# Patient Record
Sex: Male | Born: 1955 | Race: Black or African American | Hispanic: No | Marital: Married | Smoking: Never smoker
Health system: Southern US, Community
[De-identification: ages and names within clinical notes are randomized; demographics above are authoritative.]

## PROBLEM LIST (undated history)

## (undated) DIAGNOSIS — C61 Malignant neoplasm of prostate: Secondary | ICD-10-CM

## (undated) DIAGNOSIS — B1910 Unspecified viral hepatitis B without hepatic coma: Secondary | ICD-10-CM

## (undated) DIAGNOSIS — K219 Gastro-esophageal reflux disease without esophagitis: Secondary | ICD-10-CM

## (undated) DIAGNOSIS — E079 Disorder of thyroid, unspecified: Secondary | ICD-10-CM

## (undated) DIAGNOSIS — N400 Enlarged prostate without lower urinary tract symptoms: Secondary | ICD-10-CM

## (undated) DIAGNOSIS — E041 Nontoxic single thyroid nodule: Secondary | ICD-10-CM

## (undated) DIAGNOSIS — I10 Essential (primary) hypertension: Secondary | ICD-10-CM

## (undated) DIAGNOSIS — E785 Hyperlipidemia, unspecified: Secondary | ICD-10-CM

## (undated) DIAGNOSIS — J309 Allergic rhinitis, unspecified: Secondary | ICD-10-CM

## (undated) HISTORY — DX: Disorder of thyroid, unspecified: E07.9

## (undated) HISTORY — DX: Allergic rhinitis, unspecified: J30.9

## (undated) HISTORY — PX: COLONOSCOPY: SHX174

## (undated) HISTORY — DX: Unspecified viral hepatitis B without hepatic coma: B19.10

## (undated) HISTORY — PX: EYE SURGERY: SHX253

## (undated) HISTORY — DX: Gastro-esophageal reflux disease without esophagitis: K21.9

---

## 1998-09-12 ENCOUNTER — Encounter: Admission: RE | Admit: 1998-09-12 | Discharge: 1998-09-12 | Payer: Self-pay | Admitting: Internal Medicine

## 1998-10-10 ENCOUNTER — Encounter: Admission: RE | Admit: 1998-10-10 | Discharge: 1998-10-10 | Payer: Self-pay | Admitting: Internal Medicine

## 2000-05-15 ENCOUNTER — Encounter: Payer: Self-pay | Admitting: Urology

## 2000-05-15 ENCOUNTER — Encounter: Admission: RE | Admit: 2000-05-15 | Discharge: 2000-05-15 | Payer: Self-pay | Admitting: Urology

## 2007-06-17 ENCOUNTER — Encounter: Admission: RE | Admit: 2007-06-17 | Discharge: 2007-06-17 | Payer: Self-pay | Admitting: Internal Medicine

## 2007-07-10 ENCOUNTER — Ambulatory Visit (HOSPITAL_COMMUNITY): Admission: RE | Admit: 2007-07-10 | Discharge: 2007-07-10 | Payer: Self-pay | Admitting: Endocrinology

## 2007-07-10 ENCOUNTER — Encounter (INDEPENDENT_AMBULATORY_CARE_PROVIDER_SITE_OTHER): Payer: Self-pay | Admitting: Interventional Radiology

## 2010-03-26 ENCOUNTER — Encounter: Payer: Self-pay | Admitting: Internal Medicine

## 2011-08-08 ENCOUNTER — Other Ambulatory Visit: Payer: Self-pay | Admitting: Internal Medicine

## 2011-08-08 DIAGNOSIS — E041 Nontoxic single thyroid nodule: Secondary | ICD-10-CM

## 2011-08-17 ENCOUNTER — Other Ambulatory Visit: Payer: Self-pay

## 2011-08-17 ENCOUNTER — Inpatient Hospital Stay: Admission: RE | Admit: 2011-08-17 | Payer: Self-pay | Source: Ambulatory Visit

## 2011-12-20 ENCOUNTER — Ambulatory Visit: Payer: Self-pay | Admitting: Gastroenterology

## 2013-08-05 ENCOUNTER — Encounter: Payer: Self-pay | Admitting: Internal Medicine

## 2013-08-05 ENCOUNTER — Ambulatory Visit (INDEPENDENT_AMBULATORY_CARE_PROVIDER_SITE_OTHER): Payer: 59 | Admitting: Internal Medicine

## 2013-08-05 VITALS — BP 156/92 | HR 84 | Temp 98.7°F | Wt 177.0 lb

## 2013-08-05 DIAGNOSIS — N390 Urinary tract infection, site not specified: Secondary | ICD-10-CM | POA: Insufficient documentation

## 2013-08-05 MED ORDER — FOSFOMYCIN TROMETHAMINE 3 G PO PACK: PACK | ORAL | Status: DC

## 2013-08-05 NOTE — Progress Notes (Signed)
Patient ID: Tony Kim, male   DOB: [birth year], 57 y.o.   MRN: [id number]         [facility] for Infectious Disease  Reason for Consult: Urinary tract infection with multidrug resistant Escherichia coli Referring Physician: Dr. Deland Pretty  Patient Active Problem List   Diagnosis Date Noted  . UTI (urinary tract infection) 08/05/2013    Patient's Medications  New Prescriptions   FOSFOMYCIN (MONUROL) 3 G PACK    Take 3 g in 4 ounces of water or daily every 3 days for 3 doses  Previous Medications   LORATADINE-PSEUDOEPHEDRINE (CLARITIN-D 12-HOUR) 5-120 MG PER TABLET    Take 1 tablet by mouth as needed for allergies.   NAPROXEN (NAPROSYN) 250 MG TABLET    Take 250 mg by mouth as needed.   PSEUDOEPH-DOXYLAMINE-DM-APAP (NYQUIL PO)    Take 30 mLs by mouth as needed.   PSEUDOEPHEDRINE-APAP-DM (DAYQUIL MULTI-SYMPTOM COLD/FLU PO)    Take 15 mLs by mouth as needed.   TAMSULOSIN (FLOMAX) 0.4 MG CAPS CAPSULE    Take 0.4 mg by mouth.  Modified Medications   No medications on file  Discontinued Medications   DOXYCYCLINE (VIBRAMYCIN) 100 MG CAPSULE    Take 100 mg by mouth 2 (two) times daily.   NITROFURANTOIN (MACRODANTIN) 100 MG CAPSULE    Take 100 mg by mouth with snacks. Not started yet, 07/22/13    Recommendations: 1. Repeat urinalysis and urine culture 2. Fosfomycin 3 g by mouth every 3 days for 3 doses 3. Discontinue doxycycline 4. Followup on June 16   Assessment: He has smoldering symptoms of cystitis or prostatitis related to multidrug resistant Escherichia coli. His organism was resistant to all antibiotics tested other than nitrofurantoin and carbapenam antibiotics. It is highly likely that his organism would be sensitive to fosfomycin. I will treat him with a 3 dose regimen after repeat UA and urine culture. I will see him back in 2 weeks. If he has prostatitis he may need a longer course of therapy.  HPI: Tony Kim is a 58 y.o. male who developed dysuria and  frequency around May 11. This was associated with some fevers, chills and mild bilateral flank pain. He was seen on May 14. Urine culture was obtained and he was started on empiric doxycycline. His fever, chills and flank pain resolved but he has continued to have dysuria. His urine culture grew multidrug resistant Escherichia coli resistant to tetracycline meds. He was switched to oral nitrofurantoin but had no further improvement in his symptoms. He was switched back to doxycycline and referred here for further evaluation.  He recalls having one prior, similar episode about 15 years ago that resolved promptly with a short course of antibiotics. He is required other treatment with antibiotics only very rarely over his lifetime. He is married and his wife is in good health. She has not had problems with urinary tract infections or other infections that required frequent antibiotic therapy. 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  Review of Systems: Constitutional: positive for chills, fevers and sweats, negative for anorexia and weight loss Eyes: negative Ears, nose, mouth, throat, and face: negative Respiratory: negative Cardiovascular: negative Gastrointestinal: negative Genitourinary:positive for dysuria and frequency, negative for decreased stream, hematuria, hesitancy, nocturia and urinary incontinence    Past Medical History  Diagnosis Date  . GERD (gastroesophageal reflux disease)   . Thyroid disease   . Hepatitis B   . Allergic rhinitis     History  Substance Use Topics  . Smoking status: Never Smoker   . Smokeless tobacco: Never Used  . Alcohol Use: 1.0 oz/week    2 drink(s) per week    No family history on file. No Known Allergies  OBJECTIVE: Blood pressure 156/92, pulse 84, temperature 98.7 F (37.1 C), temperature source Oral, weight 177 lb (80.287 kg). General: He is well dressed and in good spirits Skin: No rash Lungs: Clear Cor: Regular  S1-S2 no murmurs Abdomen: Soft and nontender. He has no suprapubic or CVA tenderness   Microbiology: No results found for this or any previous visit (from the past 240 hour(s)).  Michel Bickers, MD [facility] for [facility] [phone] pager   [phone] cell 08/05/2013, 5:48 PM

## 2013-08-06 LAB — URINALYSIS, ROUTINE W REFLEX MICROSCOPIC
Bilirubin Urine: NEGATIVE
Glucose, UA: NEGATIVE mg/dL
Hgb urine dipstick: NEGATIVE
Ketones, ur: NEGATIVE mg/dL
LEUKOCYTES UA: NEGATIVE
NITRITE: NEGATIVE
PROTEIN: NEGATIVE mg/dL
Specific Gravity, Urine: 1.023 (ref 1.005–1.030)
UROBILINOGEN UA: 0.2 mg/dL (ref 0.0–1.0)
pH: 7 (ref 5.0–8.0)

## 2013-08-07 LAB — URINE CULTURE

## 2013-08-20 ENCOUNTER — Ambulatory Visit (INDEPENDENT_AMBULATORY_CARE_PROVIDER_SITE_OTHER): Payer: 59 | Admitting: Internal Medicine

## 2013-08-20 ENCOUNTER — Encounter: Payer: Self-pay | Admitting: Internal Medicine

## 2013-08-20 VITALS — BP 144/85 | HR 80 | Temp 98.2°F | Wt 172.2 lb

## 2013-08-20 DIAGNOSIS — N3000 Acute cystitis without hematuria: Secondary | ICD-10-CM

## 2013-08-20 NOTE — Progress Notes (Signed)
Patient ID: Tony Kim, male   DOB: [birth year], 58 y.o.   MRN: [id number]         [facility] for Infectious Disease  Patient Active Problem List   Diagnosis Date Noted  . UTI (urinary tract infection) 08/05/2013    Patient's Medications  New Prescriptions   No medications on file  Previous Medications   LORATADINE-PSEUDOEPHEDRINE (CLARITIN-D 12-HOUR) 5-120 MG PER TABLET    Take 1 tablet by mouth as needed for allergies.   NAPROXEN (NAPROSYN) 250 MG TABLET    Take 250 mg by mouth as needed.   PSEUDOEPH-DOXYLAMINE-DM-APAP (NYQUIL PO)    Take 30 mLs by mouth as needed.   PSEUDOEPHEDRINE-APAP-DM (DAYQUIL MULTI-SYMPTOM COLD/FLU PO)    Take 15 mLs by mouth as needed.   TAMSULOSIN (FLOMAX) 0.4 MG CAPS CAPSULE    Take 0.4 mg by mouth.  Modified Medications   No medications on file  Discontinued Medications   FOSFOMYCIN (MONUROL) 3 G PACK    Take 3 g in 4 ounces of water or daily every 3 days for 3 doses    Subjective: Tony Kim is in for his followup visit. He completed his course of fosfomycin than noted resolution of his dysuria. However yesterday, after an hour-long meetings and a very cold conference room, he started to feel bad and developed some mild, recurrent dysuria. He went home early from work and took some DayQuil. He slept from 7 PM until early this morning. He has had some intermittent, mild dysuria today. He has not had any fever or sweats. Review of Systems: Pertinent items are noted in HPI.  Past Medical History  Diagnosis Date  . GERD (gastroesophageal reflux disease)   . Thyroid disease   . Hepatitis B   . Allergic rhinitis     History  Substance Use Topics  . Smoking status: Never Smoker   . Smokeless tobacco: Never Used  . Alcohol Use: 1.0 oz/week    2 drink(s) per week    No family history on file.  No Known Allergies  Objective: Temp: 98.2 F (36.8 C) (06/18 1554) Temp src: Oral (06/18 1554) BP: 144/85 mmHg (06/18 1554) Pulse Rate: 80  (06/18 1554)  General: He is in good spirits and in no distress Skin: No rash Lungs: Clear Cor: Regular S1 and S2 no murmurs Abdomen: Soft without any CVA or suprapubic tenderness   Assessment: He may be developing an early relapse of his complicated urinary tract infection.  Plan: 1. Observe off of antibiotics for now 2. Repeat UA and urine culture 3. Follow up by phone early next week   Michel Bickers, MD [facility] for [place] [phone] pager   [phone] cell 08/20/2013, 4:31 PM

## 2013-08-22 LAB — URINALYSIS, ROUTINE W REFLEX MICROSCOPIC
GLUCOSE, UA: NEGATIVE mg/dL
Hgb urine dipstick: NEGATIVE
KETONES UR: NEGATIVE mg/dL
Nitrite: NEGATIVE
PROTEIN: 30 mg/dL — AB
Specific Gravity, Urine: >1.03 — ABNORMAL HIGH (ref 1.005–1.030)
Urobilinogen, UA: 0.2 mg/dL (ref 0.0–1.0)
pH: 5.5 (ref 5.0–8.0)

## 2013-08-22 LAB — URINALYSIS, MICROSCOPIC ONLY
Bacteria, UA: NONE SEEN
CRYSTALS: NONE SEEN
SQUAMOUS EPITHELIAL / LPF: NONE SEEN

## 2013-08-24 ENCOUNTER — Encounter: Payer: Self-pay | Admitting: Internal Medicine

## 2013-08-24 ENCOUNTER — Telehealth: Payer: Self-pay | Admitting: *Deleted

## 2013-08-24 LAB — URINE CULTURE: Colony Count: >=100000

## 2013-08-24 NOTE — Telephone Encounter (Signed)
Please see the patient's email question and advise.  Thank you. Tony KitchenLandis Gandy, RN ________________________ Dr. Megan Salon, I was anticipating hearing from you regarding the results from the urinary analysis as promised. What's next step? Please let me know. Thanks, Tony Kim

## 2013-08-24 NOTE — Telephone Encounter (Signed)
I spoke with Tony Kim by phone today. He has not taken his temperature but is still having cold spells and intermittent, mild dysuria. He feels like his urinary tract infection is coming back. His urinalyses last week was abnormal and urine culture grew Escherichia coli again, resistant to all oral antibiotics tested (fosfomycin susceptibility testing is not available). Since he has relapsed quickly after finishing fosfomycin he will probably need IV carbapenam therapy. He agrees with having a PICC placed and starting treatment, probably with ertapenem once daily for 2 weeks.

## 2013-08-25 ENCOUNTER — Telehealth: Payer: Self-pay | Admitting: *Deleted

## 2013-08-25 ENCOUNTER — Other Ambulatory Visit: Payer: Self-pay | Admitting: Internal Medicine

## 2013-08-25 DIAGNOSIS — N390 Urinary tract infection, site not specified: Secondary | ICD-10-CM

## 2013-08-25 NOTE — Telephone Encounter (Signed)
These arrangements have already be done (by Langley Gauss).

## 2013-08-25 NOTE — Telephone Encounter (Signed)
I'll be happy to set this up.  What strength ertapenem?

## 2013-08-25 NOTE — Telephone Encounter (Signed)
Dr. Megan Salon has ordered PICC placement and HH IV Ertapenam 1 gram daily x 14 days.  PICC placement scheduled for Wed., August 26, 2013 @ 1200.  Pt to arrive at the [facility] entrance to [facility] at 1115 for registration.  First dose of IV antibiotic to be administered by [facility] Short Stay, 08/26/13.  Order faxed to Short Stay to administer first dose of IV antibiotic ([phone]).  Pt chose to use [facility] for IV antibiotics and nursing care.  Orders faxed to [facility] along with pt records, med list and labs.  IV antibiotics duration is 14 days.

## 2013-08-26 ENCOUNTER — Ambulatory Visit (HOSPITAL_COMMUNITY)
Admission: RE | Admit: 2013-08-26 | Discharge: 2013-08-26 | Disposition: A | Discharge status: Home / Self Care | Payer: 59 | Source: Ambulatory Visit | Attending: Internal Medicine | Admitting: Internal Medicine

## 2013-08-26 ENCOUNTER — Encounter (HOSPITAL_COMMUNITY)
Admission: RE | Admit: 2013-08-26 | Discharge: 2013-08-26 | Disposition: A | Discharge status: Home / Self Care | Payer: 59 | Source: Ambulatory Visit | Attending: Internal Medicine | Admitting: Internal Medicine

## 2013-08-26 ENCOUNTER — Other Ambulatory Visit: Payer: Self-pay | Admitting: Internal Medicine

## 2013-08-26 DIAGNOSIS — A498 Other bacterial infections of unspecified site: Secondary | ICD-10-CM | POA: Diagnosis not present

## 2013-08-26 DIAGNOSIS — N390 Urinary tract infection, site not specified: Secondary | ICD-10-CM | POA: Insufficient documentation

## 2013-08-26 MED ORDER — SODIUM CHLORIDE 0.9 % IV SOLN
1.0000 g | Freq: Once | INTRAVENOUS | Status: AC
  Administered 2013-08-26: 1 g via INTRAVENOUS
  Filled 2013-08-26: qty 1

## 2013-08-26 MED ORDER — HEPARIN SOD (PORK) LOCK FLUSH 100 UNIT/ML IV SOLN: 250.0000 [IU] | INTRAVENOUS | Status: DC | PRN

## 2013-08-26 MED ORDER — HEPARIN SOD (PORK) LOCK FLUSH 100 UNIT/ML IV SOLN
INTRAVENOUS | Status: AC
  Administered 2013-08-26: 500 [IU]
  Filled 2013-08-26: qty 5

## 2013-08-26 NOTE — Procedures (Signed)
Successful RUE SL POWER PICC TIP SVC/RA NO COMP STABLE READY FOR USE  

## 2013-08-31 ENCOUNTER — Telehealth: Payer: Self-pay | Admitting: *Deleted

## 2013-08-31 NOTE — Telephone Encounter (Signed)
I will fill out his FMLA papers.

## 2013-08-31 NOTE — Telephone Encounter (Signed)
Patient experiencing extreme fatigue on his iv antibiotic therapy.  He is concerned that he is not able to work during this time, would like to know if Dr. Megan Salon would consider filling out FMLA paperwork to cover his 14 day treatment.  RN advised patient to have his paperwork sent to our office for Dr. Megan Salon to review.  We will contact the patient back with more information today. Tony Gandy, RN

## 2013-09-01 ENCOUNTER — Encounter: Payer: Self-pay | Admitting: Internal Medicine

## 2013-09-01 ENCOUNTER — Telehealth: Payer: Self-pay | Admitting: *Deleted

## 2013-09-01 NOTE — Telephone Encounter (Signed)
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 Megan Salon.  Would like a letter keeping him out of work in the mean time.  Due to his fatigue and the antibiotic administration schedule, he has been unable to come in to the office and work a full day.  While he has tried to telecommute, the fatigue again prevents him from working a full day during office hours.  He has been advised that if he cannot complete a full day telecommuting, then he should be written out of work completely.   He is requesting a doctor's note excusing him from work from the start of therapy 08/26/13 through his follow up appointment on 09/09/13.  He would like this note to be faxed to his local [place] office, attention Zenaida Niece (phone [phone], fax [phone].   Landis Gandy, RN

## 2013-09-01 NOTE — Telephone Encounter (Signed)
Faxed the letter to Zenaida Niece.  Notified patient. Landis Gandy, RN

## 2013-09-03 ENCOUNTER — Encounter: Payer: Self-pay | Admitting: *Deleted

## 2013-09-03 NOTE — Progress Notes (Signed)
Patient ID: Tony Kim, male   DOB: February 25, 1956, 58 y.o.   MRN: [id number] Received paperwork from [facility].  Requested paperwork faxed to [facility].  Pt's f/u appt is 09/09/13 @ 4:15 PM w/ Dr. Megan Salon.

## 2013-09-09 ENCOUNTER — Encounter: Payer: Self-pay | Admitting: Internal Medicine

## 2013-09-09 ENCOUNTER — Ambulatory Visit (INDEPENDENT_AMBULATORY_CARE_PROVIDER_SITE_OTHER): Payer: 59 | Admitting: Internal Medicine

## 2013-09-09 VITALS — BP 165/90 | HR 71 | Temp 97.8°F | Wt 177.0 lb

## 2013-09-09 DIAGNOSIS — N3000 Acute cystitis without hematuria: Secondary | ICD-10-CM

## 2013-09-09 NOTE — Progress Notes (Signed)
Patient ID: Tony Kim, male   DOB: 13-Aug-1955, 58 y.o.   MRN: [id number]         [facility] for Infectious Disease  Patient Active Problem List   Diagnosis Date Noted  . UTI (urinary tract infection) 08/05/2013    Patient's Medications  New Prescriptions   No medications on file  Previous Medications   ERTAPENEM SODIUM IV    Inject into the vein.   LORATADINE-PSEUDOEPHEDRINE (CLARITIN-D 12-HOUR) 5-120 MG PER TABLET    Take 1 tablet by mouth as needed for allergies.   NAPROXEN (NAPROSYN) 250 MG TABLET    Take 250 mg by mouth as needed.   PSEUDOEPH-DOXYLAMINE-DM-APAP (NYQUIL PO)    Take 30 mLs by mouth as needed.   PSEUDOEPHEDRINE-APAP-DM (DAYQUIL MULTI-SYMPTOM COLD/FLU PO)    Take 15 mLs by mouth as needed.   TAMSULOSIN (FLOMAX) 0.4 MG CAPS CAPSULE    Take 0.4 mg by mouth.  Modified Medications   No medications on file  Discontinued Medications   No medications on file    Subjective: Tony Kim is in with his wife today for his followup visit. He will complete 14 days of IV ertapenem this afternoon for his ESBL producing Escherichia coli cystitis. He has had no problems tolerating his PICC. He states that he has felt more fatigued since starting ertapenem but otherwise has not had any side effects. His dysuria resolved promptly after starting ertapenem. He has not had any fever. Review of Systems: Pertinent items are noted in HPI.  Past Medical History  Diagnosis Date  . GERD (gastroesophageal reflux disease)   . Thyroid disease   . Hepatitis B   . Allergic rhinitis     History  Substance Use Topics  . Smoking status: Never Smoker   . Smokeless tobacco: Never Used  . Alcohol Use: 1.0 oz/week    2 drink(s) per week    No family history on file.  No Known Allergies  Objective: Temp: 97.8 F (36.6 C) (07/08 1557) Temp src: Oral (07/08 1557) BP: 165/90 mmHg (07/08 1557) Pulse Rate: 71 (07/08 1557)  General: He is in no distress Skin: Right arm PICC  site appears good Lungs: Clear Cor: Regular S1 and S2 no murmurs Abdomen: Soft and nontender. No CVA tenderness   Assessment: He's had a good response to therapy for his UTI.  Plan: 1. Discontinue ertapenem this afternoon and have PICC removed 2. He may return to work on Monday, July 13 3. Followup here as needed   Tony Bickers, MD [facility] for [place] [phone] pager   [phone] cell 09/09/2013, 4:22 PM

## 2014-02-01 ENCOUNTER — Ambulatory Visit (INDEPENDENT_AMBULATORY_CARE_PROVIDER_SITE_OTHER): Payer: 59 | Admitting: Physician Assistant

## 2014-02-01 VITALS — BP 150/78 | HR 70 | Temp 98.1°F | Resp 16 | Ht 67.0 in | Wt 174.6 lb

## 2014-02-01 DIAGNOSIS — J069 Acute upper respiratory infection, unspecified: Secondary | ICD-10-CM

## 2014-02-01 MED ORDER — IPRATROPIUM BROMIDE 0.03 % NA SOLN: 2.0000 | Freq: Two times a day (BID) | NASAL | Status: DC

## 2014-02-01 MED ORDER — BENZONATATE 100 MG PO CAPS: 100.0000 mg | ORAL_CAPSULE | Freq: Three times a day (TID) | ORAL | Status: DC | PRN

## 2014-02-01 NOTE — Progress Notes (Signed)
   Subjective:    Patient ID: Tony Kim, male    DOB: [birth year], 58 y.o.   MRN: [id number]  HPI Patient presents for cough that has been present for 4 days that has gotten progressively worse and is sometimes productive. Accompanied with rhinorrhea, sore throat, sinus pressure, and congestion. Denies fever, SOB, or CP. Denies sick contacts. Has tried AlkaSeltzer, NyQuil, and Mucinex without much relief.    Review of Systems  Constitutional: Negative for fever, appetite change and fatigue.  HENT: Positive for congestion, rhinorrhea, sinus pressure and sore throat. Negative for ear discharge, ear pain, postnasal drip and sneezing.   Eyes: Negative for pain, discharge and itching.  Respiratory: Positive for cough. Negative for chest tightness, shortness of breath and wheezing.   Cardiovascular: Negative for chest pain and palpitations.  Gastrointestinal: Negative for nausea, vomiting and abdominal pain.  Allergic/Immunologic: Negative for environmental allergies and food allergies.  Neurological: Positive for headaches. Negative for dizziness and light-headedness.       Objective:   Physical Exam  Constitutional: He appears well-developed and well-nourished. No distress.  Blood pressure 150/78, pulse 70, temperature 98.1 F (36.7 C), temperature source Oral, resp. rate 16, height 5\' 7"  (1.702 m), weight 174 lb 9.6 oz (79.198 kg), SpO2 99 %.   HENT:  Head: Normocephalic and atraumatic.  Right Ear: External ear normal.  Mouth/Throat: Oropharynx is clear and moist. No oropharyngeal exudate.  Eyes: Conjunctivae are normal. Pupils are equal, round, and reactive to light. Right eye exhibits no discharge. Left eye exhibits no discharge. No scleral icterus.  Neck: Normal range of motion. Neck supple. No thyromegaly present.  Cardiovascular: Normal rate, regular rhythm and normal heart sounds.  Exam reveals no gallop and no friction rub.   No murmur heard. Pulmonary/Chest: Effort normal  and breath sounds normal. No respiratory distress. He has no wheezes. He has no rales. He exhibits no tenderness.  Abdominal: Soft. Bowel sounds are normal. There is no tenderness.  Lymphadenopathy:    He has no cervical adenopathy.  Skin: Skin is warm and dry. No rash noted. No erythema. No pallor.      Assessment & Plan:  1. Upper respiratory infection, viral Get plenty of fluid and rest.  - ipratropium (ATROVENT) 0.03 % nasal spray; Place 2 sprays into both nostrils 2 (two) times daily.  Dispense: 30 mL; Refill: 0 - benzonatate (TESSALON) 100 MG capsule; Take 1-2 capsules (100-200 mg total) by mouth 3 (three) times daily as needed for cough.  Dispense: 40 capsule; Refill: 0 - Continue to take Mucinex.   Alveta Heimlich PA-C  [facility] 02/01/2014 3:20 PM

## 2014-02-01 NOTE — Patient Instructions (Signed)
Upper Respiratory Infection, Adult An upper respiratory infection (URI) is also sometimes known as the common cold. The upper respiratory tract includes the nose, sinuses, throat, trachea, and bronchi. Bronchi are the airways leading to the lungs. Most people improve within 1 week, but symptoms can last up to 2 weeks. A residual cough may last even longer.  CAUSES Many different viruses can infect the tissues lining the upper respiratory tract. The tissues become irritated and inflamed and often become very moist. Mucus production is also common. A cold is contagious. You can easily spread the virus to others by oral contact. This includes kissing, sharing a glass, coughing, or sneezing. Touching your mouth or nose and then touching a surface, which is then touched by another person, can also spread the virus. SYMPTOMS  Symptoms typically develop 1 to 3 days after you come in contact with a cold virus. Symptoms vary from person to person. They may include:  Runny nose.  Sneezing.  Nasal congestion.  Sinus irritation.  Sore throat.  Loss of voice (laryngitis).  Cough.  Fatigue.  Muscle aches.  Loss of appetite.  Headache.  Low-grade fever. DIAGNOSIS  You might diagnose your own cold based on familiar symptoms, since most people get a cold 2 to 3 times a year. Your caregiver can confirm this based on your exam. Most importantly, your caregiver can check that your symptoms are not due to another disease such as strep throat, sinusitis, pneumonia, asthma, or epiglottitis. Blood tests, throat tests, and X-rays are not necessary to diagnose a common cold, but they may sometimes be helpful in excluding other more serious diseases. Your caregiver will decide if any further tests are required. RISKS AND COMPLICATIONS  You may be at risk for a more severe case of the common cold if you smoke cigarettes, have chronic heart disease (such as heart failure) or lung disease (such as asthma), or if  you have a weakened immune system. The very young and very old are also at risk for more serious infections. Bacterial sinusitis, middle ear infections, and bacterial pneumonia can complicate the common cold. The common cold can worsen asthma and chronic obstructive pulmonary disease (COPD). Sometimes, these complications can require emergency medical care and may be life-threatening. PREVENTION  The best way to protect against getting a cold is to practice good hygiene. Avoid oral or hand contact with people with cold symptoms. Wash your hands often if contact occurs. There is no clear evidence that vitamin C, vitamin E, echinacea, or exercise reduces the chance of developing a cold. However, it is always recommended to get plenty of rest and practice good nutrition. TREATMENT  Treatment is directed at relieving symptoms. There is no cure. Antibiotics are not effective, because the infection is caused by a virus, not by bacteria. Treatment may include:  Increased fluid intake. Sports drinks offer valuable electrolytes, sugars, and fluids.  Breathing heated mist or steam (vaporizer or shower).  Eating chicken soup or other clear broths, and maintaining good nutrition.  Getting plenty of rest.  Using gargles or lozenges for comfort.  Controlling fevers with ibuprofen or acetaminophen as directed by your caregiver.  Increasing usage of your inhaler if you have asthma. Zinc gel and zinc lozenges, taken in the first 24 hours of the common cold, can shorten the duration and lessen the severity of symptoms. Pain medicines may help with fever, muscle aches, and throat pain. A variety of non-prescription medicines are available to treat congestion and runny nose. Your caregiver   can make recommendations and may suggest nasal or lung inhalers for other symptoms.  HOME CARE INSTRUCTIONS   Only take over-the-counter or prescription medicines for pain, discomfort, or fever as directed by your  caregiver.  Use a warm mist humidifier or inhale steam from a shower to increase air moisture. This may keep secretions moist and make it easier to breathe.  Drink enough water and fluids to keep your urine clear or pale yellow.  Rest as needed.  Return to work when your temperature has returned to normal or as your caregiver advises. You may need to stay home longer to avoid infecting others. You can also use a face mask and careful hand washing to prevent spread of the virus. SEEK MEDICAL CARE IF:   After the first few days, you feel you are getting worse rather than better.  You need your caregiver's advice about medicines to control symptoms.  You develop chills, worsening shortness of breath, or brown or red sputum. These may be signs of pneumonia.  You develop yellow or brown nasal discharge or pain in the face, especially when you bend forward. These may be signs of sinusitis.  You develop a fever, swollen neck glands, pain with swallowing, or white areas in the back of your throat. These may be signs of strep throat. SEEK IMMEDIATE MEDICAL CARE IF:   You have a fever.  You develop severe or persistent headache, ear pain, sinus pain, or chest pain.  You develop wheezing, a prolonged cough, cough up blood, or have a change in your usual mucus (if you have chronic lung disease).  You develop sore muscles or a stiff neck. Document Released: 08/15/2000 Document Revised: 05/14/2011 Document Reviewed: 05/27/2013 Exit[employer] Patient Information 2015 [employer]. This information is not intended to replace advice given to you by your health care provider. Make sure you discuss any questions you have with your health care provider.  

## 2014-02-03 NOTE — Progress Notes (Signed)
I have discussed this case with Ms. Ricki Miller, PA-C and agree.

## 2015-03-06 HISTORY — PX: PROSTATE BIOPSY: SHX241

## 2015-08-24 ENCOUNTER — Other Ambulatory Visit: Payer: Self-pay | Admitting: Internal Medicine

## 2015-08-24 DIAGNOSIS — E041 Nontoxic single thyroid nodule: Secondary | ICD-10-CM

## 2015-09-27 ENCOUNTER — Other Ambulatory Visit: Payer: Self-pay

## 2016-11-12 ENCOUNTER — Other Ambulatory Visit: Payer: Self-pay | Admitting: Internal Medicine

## 2016-11-12 DIAGNOSIS — E041 Nontoxic single thyroid nodule: Secondary | ICD-10-CM

## 2016-11-16 ENCOUNTER — Other Ambulatory Visit: Payer: Self-pay | Admitting: Pharmacist

## 2016-11-20 ENCOUNTER — Encounter: Payer: Self-pay | Admitting: Internal Medicine

## 2016-11-20 DIAGNOSIS — B181 Chronic viral hepatitis B without delta-agent: Secondary | ICD-10-CM | POA: Insufficient documentation

## 2016-11-21 ENCOUNTER — Ambulatory Visit
Admission: RE | Admit: 2016-11-21 | Discharge: 2016-11-21 | Disposition: A | Discharge status: Home / Self Care | Payer: 59 | Source: Ambulatory Visit | Attending: Internal Medicine | Admitting: Internal Medicine

## 2016-11-21 DIAGNOSIS — E041 Nontoxic single thyroid nodule: Secondary | ICD-10-CM

## 2016-12-03 ENCOUNTER — Encounter: Payer: Self-pay | Admitting: Internal Medicine

## 2016-12-03 ENCOUNTER — Ambulatory Visit (INDEPENDENT_AMBULATORY_CARE_PROVIDER_SITE_OTHER): Payer: 59 | Admitting: Internal Medicine

## 2016-12-03 DIAGNOSIS — B181 Chronic viral hepatitis B without delta-agent: Secondary | ICD-10-CM | POA: Diagnosis not present

## 2016-12-03 NOTE — Progress Notes (Signed)
[facility]  Reason for Consult: Chronic hepatitis B Referring Physician: Dr. Deland Pretty  Assessment: He has chronic active hepatitis B. He is e antigen positive as of one month ago with a viral load of 82,400,000. I will check a full set of baseline lab today including a liver fibrosis panel and see him back in one month to review treatment options. I also suggested having his wife tested again and vaccinated if she is not hepatitis B surface antibody positive.  Plan: 1. Check baseline lab work today 2. Recommend hepatitis B testing and vaccination for his wife 3. Follow-up in one month   Patient Active Problem List   Diagnosis Date Noted  . Chronic viral hepatitis B without delta-agent ([place]) 11/20/2016  . UTI (urinary tract infection) 08/05/2013    Patient's Medications  New Prescriptions   No medications on file  Previous Medications   BENZONATATE (TESSALON) 100 MG CAPSULE    Take 1-2 capsules (100-200 mg total) by mouth 3 (three) times daily as needed for cough.   IPRATROPIUM (ATROVENT) 0.03 % NASAL SPRAY    Place 2 sprays into both nostrils 2 (two) times daily.   LORATADINE-PSEUDOEPHEDRINE (CLARITIN-D 12-HOUR) 5-120 MG PER TABLET    Take 1 tablet by mouth as needed for allergies.   NAPROXEN (NAPROSYN) 250 MG TABLET    Take 250 mg by mouth as needed.   PSEUDOEPH-DOXYLAMINE-DM-APAP (NYQUIL PO)    Take 30 mLs by mouth as needed.   PSEUDOEPHEDRINE-APAP-DM (DAYQUIL MULTI-SYMPTOM COLD/FLU PO)    Take 15 mLs by mouth as needed.   TAMSULOSIN (FLOMAX) 0.4 MG CAPS CAPSULE    Take 0.4 mg by mouth.  Modified Medications   No medications on file  Discontinued Medications   No medications on file    HPI: Tony Kim is a 61 y.o. male who is referred to me for evaluation and management of chronic hepatitis B. He states that he first learned of his hepatitis B Infection in the 1980s. He believes he was infected by his mother who died this past  2022/09/20. She had hepatitis B infection. He denies other risk fctors. He says that he has never had any complications of his infection that he is aware of. He has never been on treatment. He says that his wife has been tested and is hepatitis B negative.He is not sure if she has ever been vaccinated. 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  Review of Systems: Review of Systems  Constitutional: Negative for chills, diaphoresis, fever, malaise/fatigue and weight loss.  HENT: Negative for sore throat.   Respiratory: Negative for cough, sputum production and shortness of breath.   Cardiovascular: Negative for chest pain.  Gastrointestinal: Negative for abdominal pain, diarrhea, heartburn, nausea and vomiting.  Genitourinary: Negative for dysuria and frequency.  Musculoskeletal: Negative for joint pain and myalgias.  Skin: Negative for rash.  Neurological: Negative for dizziness and headaches.  Psychiatric/Behavioral: Negative for depression and substance abuse. The patient is not nervous/anxious.       Past Medical History:  Diagnosis Date  . Allergic rhinitis   . GERD (gastroesophageal reflux disease)   . Hepatitis B   . Thyroid disease     Social History  Substance Use Topics  . Smoking status: Never Smoker  . Smokeless tobacco: Never Used  . Alcohol use 1.0 oz/week    2 drink(s)  per week    Family History  Problem Relation Age of Onset  . Hyperlipidemia Mother   . Hyperlipidemia Father    No Known Allergies  OBJECTIVE: Vitals:   12/03/16 1425  BP: (!) 165/92  Pulse: 69  Temp: (!) 97.5 F (36.4 C)  TempSrc: Oral  Weight: 184 lb (83.5 kg)  Height: 5\' 6"  (1.676 m)   Body mass index is 29.7 kg/m.   Physical Exam  Constitutional: He is oriented to person, place, and time.  HENT:  Mouth/Throat: No oropharyngeal exudate.  Eyes: Conjunctivae are normal.  Cardiovascular: Normal rate  and regular rhythm.   No murmur heard. Pulmonary/Chest: Breath sounds normal.  Abdominal: Soft. He exhibits no mass. There is no tenderness.  Musculoskeletal: Normal range of motion.  Neurological: He is alert and oriented to person, place, and time.  Skin: No rash noted.  Psychiatric: Mood and affect normal.    Microbiology: No results found for this or any previous visit (from the past 240 hour(s)).  Michel Bickers, MD [facility] [phone] pager   [phone] cell 12/03/2016, 2:57 PM

## 2016-12-05 LAB — LIVER FIBROSIS, FIBROTEST-ACTITEST
ALT: 51 U/L — ABNORMAL HIGH (ref 9–46)
APOLIPOPROTEIN A1: 161 mg/dL (ref 94–176)
Alpha-2-Macroglobulin: 144 mg/dL (ref 106–279)
Bilirubin: 0.7 mg/dL (ref 0.2–1.2)
Fibrosis Score: 0.41
GGT: 18 U/L (ref 3–70)
Haptoglobin: 12 mg/dL — ABNORMAL LOW (ref 43–212)
NECROINFLAMMAT ACT SCORE: 0.33
Reference ID: 2140783

## 2016-12-07 LAB — HEPATITIS B SURFACE ANTIGEN: Hepatitis B Surface Ag: REACTIVE — AB

## 2016-12-07 LAB — COMPREHENSIVE METABOLIC PANEL
AG RATIO: 1.7 (calc) (ref 1.0–2.5)
ALBUMIN MSPROF: 3.8 g/dL (ref 3.6–5.1)
ALT: 54 U/L — ABNORMAL HIGH (ref 9–46)
AST: 85 U/L — ABNORMAL HIGH (ref 10–35)
Alkaline phosphatase (APISO): 47 U/L (ref 40–115)
BUN: 12 mg/dL (ref 7–25)
CO2: 27 mmol/L (ref 20–32)
Calcium: 8.3 mg/dL — ABNORMAL LOW (ref 8.6–10.3)
Chloride: 102 mmol/L (ref 98–110)
Creat: 1.01 mg/dL (ref 0.70–1.25)
Globulin: 2.3 g/dL (calc) (ref 1.9–3.7)
Glucose, Bld: 134 mg/dL — ABNORMAL HIGH (ref 65–99)
POTASSIUM: 4.1 mmol/L (ref 3.5–5.3)
Sodium: 139 mmol/L (ref 135–146)
Total Bilirubin: 1.1 mg/dL (ref 0.2–1.2)
Total Protein: 6.1 g/dL (ref 6.1–8.1)

## 2016-12-07 LAB — CBC
HCT: 36.2 % — ABNORMAL LOW (ref 38.5–50.0)
Hemoglobin: 12 g/dL — ABNORMAL LOW (ref 13.2–17.1)
MCH: 27.4 pg (ref 27.0–33.0)
MCHC: 33.1 g/dL (ref 32.0–36.0)
MCV: 82.6 fL (ref 80.0–100.0)
MPV: 11.1 fL (ref 7.5–12.5)
Platelets: 179 10*3/uL (ref 140–400)
RBC: 4.38 10*6/uL (ref 4.20–5.80)
RDW: 13.6 % (ref 11.0–15.0)
WBC: 3.4 10*3/uL — ABNORMAL LOW (ref 3.8–10.8)

## 2016-12-07 LAB — HEPATITIS DELTA ANTIBODY: Hepatitis D Ab, Total: NEGATIVE

## 2016-12-07 LAB — HIV ANTIBODY (ROUTINE TESTING W REFLEX): HIV: NONREACTIVE

## 2016-12-07 LAB — HEPATITIS A ANTIBODY, TOTAL: Hepatitis A AB,Total: REACTIVE — AB

## 2016-12-07 LAB — HEPATITIS C ANTIBODY
HEP C AB: NONREACTIVE
SIGNAL TO CUT-OFF: 0.01 (ref <1.00)

## 2017-01-31 DIAGNOSIS — R972 Elevated prostate specific antigen [PSA]: Secondary | ICD-10-CM | POA: Diagnosis not present

## 2017-11-13 DIAGNOSIS — Z0000 Encounter for general adult medical examination without abnormal findings: Secondary | ICD-10-CM | POA: Diagnosis not present

## 2017-11-13 DIAGNOSIS — Z125 Encounter for screening for malignant neoplasm of prostate: Secondary | ICD-10-CM | POA: Diagnosis not present

## 2017-11-18 DIAGNOSIS — Z0001 Encounter for general adult medical examination with abnormal findings: Secondary | ICD-10-CM | POA: Diagnosis not present

## 2017-11-18 DIAGNOSIS — Z23 Encounter for immunization: Secondary | ICD-10-CM | POA: Diagnosis not present

## 2017-11-19 ENCOUNTER — Other Ambulatory Visit: Payer: Self-pay | Admitting: Internal Medicine

## 2017-11-19 DIAGNOSIS — E041 Nontoxic single thyroid nodule: Secondary | ICD-10-CM

## 2017-11-28 ENCOUNTER — Encounter: Payer: Self-pay | Admitting: Hematology and Oncology

## 2017-11-28 ENCOUNTER — Ambulatory Visit
Admission: RE | Admit: 2017-11-28 | Discharge: 2017-11-28 | Disposition: A | Discharge status: Home / Self Care | Payer: 59 | Source: Ambulatory Visit | Attending: Internal Medicine | Admitting: Internal Medicine

## 2017-11-28 DIAGNOSIS — E041 Nontoxic single thyroid nodule: Secondary | ICD-10-CM

## 2017-12-05 ENCOUNTER — Encounter: Payer: Self-pay | Admitting: Hematology and Oncology

## 2017-12-05 ENCOUNTER — Inpatient Hospital Stay: Payer: BLUE CROSS/BLUE SHIELD

## 2017-12-05 ENCOUNTER — Inpatient Hospital Stay: Payer: BLUE CROSS/BLUE SHIELD | Attending: Hematology and Oncology | Admitting: Hematology and Oncology

## 2017-12-05 ENCOUNTER — Telehealth: Payer: Self-pay | Admitting: Hematology and Oncology

## 2017-12-05 VITALS — BP 144/96 | HR 71 | Temp 98.5°F | Resp 20 | Ht 66.0 in | Wt 184.0 lb

## 2017-12-05 DIAGNOSIS — D709 Neutropenia, unspecified: Secondary | ICD-10-CM | POA: Diagnosis not present

## 2017-12-05 DIAGNOSIS — E785 Hyperlipidemia, unspecified: Secondary | ICD-10-CM | POA: Diagnosis not present

## 2017-12-05 DIAGNOSIS — K219 Gastro-esophageal reflux disease without esophagitis: Secondary | ICD-10-CM

## 2017-12-05 DIAGNOSIS — B1910 Unspecified viral hepatitis B without hepatic coma: Secondary | ICD-10-CM

## 2017-12-05 DIAGNOSIS — R972 Elevated prostate specific antigen [PSA]: Secondary | ICD-10-CM | POA: Insufficient documentation

## 2017-12-05 DIAGNOSIS — N400 Enlarged prostate without lower urinary tract symptoms: Secondary | ICD-10-CM

## 2017-12-05 DIAGNOSIS — E7800 Pure hypercholesterolemia, unspecified: Secondary | ICD-10-CM | POA: Insufficient documentation

## 2017-12-05 DIAGNOSIS — Z8619 Personal history of other infectious and parasitic diseases: Secondary | ICD-10-CM | POA: Diagnosis not present

## 2017-12-05 DIAGNOSIS — R041 Hemorrhage from throat: Secondary | ICD-10-CM

## 2017-12-05 DIAGNOSIS — D72819 Decreased white blood cell count, unspecified: Secondary | ICD-10-CM | POA: Insufficient documentation

## 2017-12-05 LAB — COMPREHENSIVE METABOLIC PANEL
ALBUMIN: 3.9 g/dL (ref 3.5–5.0)
ALK PHOS: 63 U/L (ref 38–126)
ALT: 38 U/L (ref 0–44)
AST: 23 U/L (ref 15–41)
Anion gap: 6 (ref 5–15)
BILIRUBIN TOTAL: 1.2 mg/dL (ref 0.3–1.2)
BUN: 15 mg/dL (ref 8–23)
CALCIUM: 9 mg/dL (ref 8.9–10.3)
CO2: 30 mmol/L (ref 22–32)
CREATININE: 1.16 mg/dL (ref 0.61–1.24)
Chloride: 105 mmol/L (ref 98–111)
GFR calc Af Amer: >60 mL/min (ref >60)
Glucose, Bld: 90 mg/dL (ref 70–99)
Potassium: 4 mmol/L (ref 3.5–5.1)
SODIUM: 141 mmol/L (ref 135–145)
TOTAL PROTEIN: 7.1 g/dL (ref 6.5–8.1)

## 2017-12-05 LAB — RETICULOCYTES
RBC.: 4.9 MIL/uL (ref 4.20–5.82)
RETIC CT PCT: 0.7 % — AB (ref 0.8–1.8)
Retic Count, Absolute: 34.3 10*3/uL — ABNORMAL LOW (ref 34.8–93.9)

## 2017-12-05 LAB — CBC WITH DIFFERENTIAL (CANCER CENTER ONLY)
BASOS PCT: 1 %
Basophils Absolute: 0 10*3/uL (ref 0.0–0.1)
Eosinophils Absolute: 0.4 10*3/uL (ref 0.0–0.5)
Eosinophils Relative: 12 %
HEMATOCRIT: 40.7 % (ref 38.4–49.9)
HEMOGLOBIN: 13.4 g/dL (ref 13.0–17.1)
LYMPHS PCT: 45 %
Lymphs Abs: 1.3 10*3/uL (ref 0.9–3.3)
MCH: 27.3 pg (ref 27.2–33.4)
MCHC: 32.9 g/dL (ref 32.0–36.0)
MCV: 83.1 fL (ref 79.3–98.0)
MONO ABS: 0.2 10*3/uL (ref 0.1–0.9)
MONOS PCT: 5 %
NEUTROS ABS: 1.1 10*3/uL — AB (ref 1.5–6.5)
NEUTROS PCT: 37 %
Platelet Count: 203 10*3/uL (ref 140–400)
RBC: 4.9 MIL/uL (ref 4.20–5.82)
RDW: 14.1 % (ref 11.0–14.6)
WBC Count: 3 10*3/uL — ABNORMAL LOW (ref 4.0–10.3)

## 2017-12-05 LAB — FOLATE: Folate: 12.1 ng/mL (ref >5.9)

## 2017-12-05 LAB — VITAMIN B12: VITAMIN B 12: 460 pg/mL (ref 180–914)

## 2017-12-05 LAB — C-REACTIVE PROTEIN: CRP: <0.8 mg/dL (ref <1.0)

## 2017-12-05 LAB — SEDIMENTATION RATE: Sed Rate: 6 mm/hr (ref 0–16)

## 2017-12-05 LAB — LACTATE DEHYDROGENASE: LDH: 194 U/L — ABNORMAL HIGH (ref 98–192)

## 2017-12-05 NOTE — Telephone Encounter (Signed)
ge pt avs and calendar

## 2017-12-05 NOTE — Patient Instructions (Signed)
Evidence Yahr December 05, 2017  We discussed in detail the role, rationale, and possible explanation for a low white blood cell count.  At the time of your most recent laboratory studies and early September, there was no evidence of anemia or low platelets.  We discussed those prior results.  Additionally we discussed in lengthy detail, your long-standing history of hepatitis B, identified initially in the 45s.  There is curative treatment available at the present time.  Although you have seen Dr. Benson Norway, gastroenterology, in the past and he has recommended treatment, we will hold off, at your request, referring you until the results of your lab work from today are available for review.  Laboratory studies were obtained today to exclude any additional contributing factors such as nutritional deficiency, premature destruction of white blood cells, metabolic anomalies, or mineral deficiency.  Those results are pending.  Quantitative hepatitis B DNA was also requested.  Because of the inherent risk of liver problems and/or malignancy, an ultrasound of the liver/spleen was requested along with a specific baseline blood test.  This blood test is an associated marker associated for primary liver cancer.  The results of these blood tests will be discussed in detail at the time of your next visit.  Barring any unforeseen complications, your next scheduled doctor visit to discuss those results in detail he is on October 22.  Please do not hesitate to call should any questions arise in the interim.  Thank you!  Ladona Ridgel, MD Hematology/Oncology [phone]

## 2017-12-05 NOTE — Progress Notes (Signed)
[facility] Initial Consultation  Patient Name:  Tony Kim  DOB: Apr 10, 1955   Date of Service: December 05, 2017  Referring Provider: Deland Pretty, [address] [place], [place] [address]   Consulting Physician: Henreitta Leber, MD Hematology/Oncology  Patient Care Team: Patient Care Team: Deland Pretty, MD as PCP - General (Internal Medicine)   Reason for Referral: In the setting of leukopenia/neutropenia, without anemia or thrombocytopenia, he presents now for further diagnostic and therapeutic recommendations.  History Present Illness: Tony Kim is a 62 year old resident of [place], generally from [place], whose past medical history is significant for allergic rhinitis; benign thyroid nodule; untreated dyslipidemia; repaired detached retina; elevated PSA status post prostate biopsy in 2017; mild benign prostatic hypertrophy; and untreated hepatitis B identified initially in the 1980s by report. On December 03, 2016 his hepatitis A antibody was reactive.  The hepatitis D antibody was negative.  The hepatitis C antibody was nonreactive.  The hepatitis B surface antigen was reactive.  The hepatitis Be antibody was positive.  HIV 1 and HIV 2 were nonreactive.  The quantitative HBV DNA by PCR assay was 82,400,000. Although he had a screening colonoscopy in early 2019, under the direction of Dr.Patrick Benson Norway, gastroenterology, at [facility] and treatment of his hepatitis B was recommended, Tony Kim failed to follow-up on the recommendation, however.  His mother ironically died of complications of hepatitis B at the age of 76 years.  His primary care physician is Dr. Deland Pretty.  He is alone at this first visit.   A review of his laboratory studies from December 31, 2012 reveal hemoglobin 14.0 hematocrit 42.1 MCV 84.4 MCH 28.1 RDW 13.3 WBC 3.6; platelets 189,000.  On January 06, 2014 a complete blood count showed  hemoglobin 12.5 hematocrit 37.5 WBC 3.3; platelets 205,000.  On August 19, 2015 complete blood count showed hemoglobin 12.8 hematocrit 39.4 WBC 3.8; platelets 205,000.  On December 03, 2016 complete blood count showed hemoglobin 12.0 hematocrit 36.2 WBC 3.4; platelets 179,000.  A comprehensive metabolic panel on December 03, 2016 was normal except for calcium 8.3 SGOT 85 SGPT 54.  Tony Kim has no essential hypertension, coronary artery disease, or cardiac dysrhythmia.  His blood pressure, however, was elevated on 2 separate occasions in the clinic.  He does not have an ambulatory blood pressure kit.  He reports no seizure disorder or stroke syndrome. There has no thyroid disease.  He denies peptic ulcer or gastroesophageal reflux disease.  He has no rheumatoid or gouty arthritis.  Because his PSA was elevated and he complains even now of urinary urgency, likely benign prostatic hypertrophy, his prostate biopsy showed no malignancy in 2017.  He takes no medication.  He has no peripheral arterial venous thromboembolic disease.  In his family there is no history of any blood disorder or bleeding tendency.  Clinically both his appetite and weight remain stable. He has no unusual headache, dizziness, lightheadedness, syncope, or near syncopal episodes.  He has no rash or itching. Although he had a detached retina on the right, surgically repaired 2 years earlier, he still has some visual changes.  He reports no hearing deficit.  He reports no pain or difficulty in swallowing.  He has no unusual cough, sore throat, or orthopnea.  He denies dyspnea either at rest or on exertion.  No fever, shaking chills, sweats, or flulike symptoms are evident.  He has no chest or abdominal pain.  He reports no heartburn or indigestion.  There  is no nausea, vomiting, diarrhea, or constipation.  He moves his bowels 1-2 times daily.  He has no inflammatory bowel disease or symptomatic diverticulosis.  He denies melena or bright red blood per  rectum.  He has no urinary frequency, hematuria, or dysuria.  There is no bone, joint, muscle pain.  He denies any bleeding tendency or easy bruisability.  There is no numbness or tingling in the fingers or toes.  It is with this background he presents now for further diagnostic and therapeutic recommendations in the setting of leukopenia and mild neutropenia as outlined above.  Past Medical History:  Diagnosis Date  . Allergic rhinitis   . GERD (gastroesophageal reflux disease)   . Hepatitis B   . Thyroid disease   Dyslipidemia  Surgical History: Thyroid biopsy, 2009 Transrectal prostate biopsy, 2017 Detached retina surgery on the right, 2 years ago  Family History  Problem Relation Age of Onset  . Hyperlipidemia Mother   . Hyperlipidemia Father   Mother: Deceased: Age 81 years: Hepatitis B Father: Age 31 years: Degenerative joint disease Brothers (65): One brother died of bleeding peptic ulcer disease at the age of 57 years Sisters (2): One sister has kidney problems. There is no history of any blood disorders in the immediate family.  Social History   Socioeconomic History  . Marital status: Married    Spouse name: Not on file  . Number of children: Not on file  . Years of education: Not on file  . Highest education level: Not on file  Occupational History  . Not on file  Social Needs  . Financial resource strain: Not on file  . Food insecurity:    Worry: Not on file    Inability: Not on file  . Transportation needs:    Medical: Not on file    Non-medical: Not on file  Tobacco Use  . Smoking status: Never Smoker  . Smokeless tobacco: Never Used  Substance and Sexual Activity  . Alcohol use: Yes    Alcohol/week: 2.0 standard drinks    Types: 2 drink(s) per week  . Drug use: No  . Sexual activity: Yes    Partners: Female  Lifestyle  . Physical activity:    Days per week: Not on file    Minutes per session: Not on file  . Stress: Not on file  Relationships  .  Social connections:    Talks on phone: Not on file    Gets together: Not on file    Attends religious service: Not on file    Active member of club or organization: Not on file    Attends meetings of clubs or organizations: Not on file    Relationship status: Not on file  . Intimate partner violence:    Fear of current or ex partner: Not on file    Emotionally abused: Not on file    Physically abused: Not on file    Forced sexual activity: Not on file  Other Topics Concern  . Not on file  Social History Narrative  . Not on file  Tony Kim is married for the past 33 years. He worked previously as a Writer. He is currently unemployed. He has 2 healthy children ages 85 and 76 years. He is a lifetime non-smoker nondrinker He reports no recreational drug use.  Transfusion History: No prior transfusion  Exposure History: No known exposure to toxic chemicals, radiation, or pesticides.  Allergies:  He has no known medical allergies He has  no food allergies He has nonspecific seasonal allergies  Current Medications: Omega-3 fish oils: Take sporadically  Review of Systems: Constitutional: No fever, sweats, or shaking chills.  No appetite or weight deficit; energy level stable. Skin: No rash, scaling, sores, lumps, or jaundice. HEENT: No new visual changes or hearing deficit. Pulmonary: No unusual cough, sore throat, or orthopnea; without DOE or COPD. Cardiovascular: No coronary artery disease, angina, or myocardial infarction.  No cardiac dysrhythmia, essential hypertension; dyslipidemia. Gastrointestinal: No indigestion, dysphagia, abdominal pain, diarrhea, or constipation.  No change in bowel habits; reactive hepatitis B serology and elevated quantitative PCR identified. Genitourinary: No urinary frequency, hematuria, or dysuria.  Urinary urgency; BPH.  Elevated PSA followed by Dr. Alyson Ingles. Musculoskeletal: No arthralgias or myalgias; no joint swelling, pain,  or instability. Hematologic: No bleeding tendency or easy bruisability; leukopenia; relative eosinophilia without absolute eosinophilia. Endocrine: No intolerance to heat or cold; benign thyroid nodule; no diabetes mellitus. Vascular: No peripheral arterial or venous thromboembolic disease. Psychological: No anxiety, depression, or mood changes; no mental health illnesses. Neurological: No dizziness, lightheadedness, syncope, or near syncopal episodes; no numbness or tingling in the fingers or toes.  Physical Examination: Vital Signs: Body surface area is 1.97 meters squared.  Vitals:   12/05/17 1244 12/05/17 1416  BP: (!) 178/102 (!) 144/96  Pulse: 71   Resp: 20   Temp: 98.5 F (36.9 C)   SpO2: 100%     Filed Weights   12/05/17 1244  Weight: 184 lb (83.5 kg)  ECOG PERFORMANCE STATUS: 1 Constitutional:  Tony Kim is a fully nourished and developed African American from [place] originally.  He looks age appropriate.  He is friendly and cooperative without respiratory compromise at rest. Skin: No rashes, scaling, dryness, jaundice, or itching. HEENT: Head is normocephalic and atraumatic.  Pupils are equal round and reactive to light and accommodation.  Sclerae are anicteric.  Bilateral arcus senilis.  Conjunctivae are pink.  No sinus tenderness nor oropharyngeal lesions.  Lips without cracking or peeling; tongue without mass, inflammation, or nodularity.  Mucous membranes are moist. Neck: Supple and symmetric.  No jugular venous distention or thyromegaly.  Trachea is midline. Lymphatics: No cervical or supraclavicular lymphadenopathy.  No epitrochlear, axillary, or inguinal lymphadenopathy is appreciated. Respiratory/chest: Thorax is symmetrical.  Breath sounds are clear to auscultation and percussion.  Normal excursion and respiratory effort. Back: Symmetric without deformity or tenderness. Cardiovascular: Heart rate and rhythm are regular without murmurs or gallops.   Gastrointestinal: Abdomen is soft, nontender; no organomegaly.  Bowel sounds are normoactive.  No masses are appreciated. Genitourinary: Normal external male genitalia. Rectal examination: Not performed. Extremities: In the lower extremities, there is no asymmetric swelling, erythema, tenderness, or cord formation.  No clubbing, cyanosis, nor edema. Hematologic: No petechiae, hematomas, or ecchymoses. Psychological:  He is oriented to person, place, and time; normal affect, memory, and cognition. Neurological: There are no gross neurologic deficits.  Laboratory Results: I have reviewed the data as listed: December 05, 2017  Ref Range & Units 14:25 48yrago  WBC Count 4.0 - 10.3 K/uL 3.0Low   3.4Low  R  RBC 4.20 - 5.82 MIL/uL 4.90  4.38 R  Hemoglobin 13.0 - 17.1 g/dL 13.4  12.0Low  R  HCT 38.4 - 49.9 % 40.7  36.2Low  R  MCV 79.3 - 98.0 fL 83.1  82.6 R  MCH 27.2 - 33.4 pg 27.3  27.4 R  MCHC 32.0 - 36.0 g/dL 32.9  33.1   RDW 11.0 - 14.6 % 14.1  13.6 R  Platelet Count 140 - 400 K/uL 203  179 R  Neutrophils Relative % % 37    Neutro Abs 1.5 - 6.5 K/uL 1.1Low     Lymphocytes Relative % 45    Lymphs Abs 0.9 - 3.3 K/uL 1.3    Monocytes Relative % 5    Monocytes Absolute 0.1 - 0.9 K/uL 0.2    Eosinophils Relative % 12    Eosinophils Absolute 0.0 - 0.5 K/uL 0.4    Basophils Relative % 1    Basophils Absolute 0.0 - 0.1 K/uL 0.0     CBC Latest Ref Rng & Units 12/05/2017 12/03/2016  WBC 4.0 - 10.3 K/uL 3.0(L) 3.4(L)  Hemoglobin 13.0 - 17.1 g/dL 13.4 12.0(L)  Hematocrit 38.4 - 49.9 % 40.7 36.2(L)  Platelets 140 - 400 K/uL 203 179    CMP Latest Ref Rng & Units 12/05/2017 12/03/2016 12/03/2016  Glucose 70 - 99 mg/dL 90 134(H) -  BUN 8 - 23 mg/dL 15 12 -  Creatinine 0.61 - 1.24 mg/dL 1.16 1.01 -  Sodium 135 - 145 mmol/L 141 139 -  Potassium 3.5 - 5.1 mmol/L 4.0 4.1 -  Chloride 98 - 111 mmol/L 105 102 -  CO2 22 - 32 mmol/L 30 27 -  Calcium 8.9 - 10.3 mg/dL 9.0 8.3(L) -  Total Protein 6.5 -  8.1 g/dL 7.1 6.1 -  Total Bilirubin 0.3 - 1.2 mg/dL 1.2 1.1 -  Alkaline Phos 38 - 126 U/L 63 - -  AST 15 - 41 U/L 23 85(H) -  ALT 0 - 44 U/L 38 54(H) 51(H)   Diagnostic/Imaging Studies: June 17, 2007  ULTRASOUND OF HEAD/NECK SOFT TISSUES   Technique: Ultrasound examination of the head and neck soft tissues was performed in the area of clinical concern.   Comparison: None   Findings: The thyroid gland is somewhat elongate but otherwise normal in size.  The right lobe measures 6.6 cm sagittally with a depth of 1.6 cm and width of 1.9 cm.  The left lobe measures 5.9 x 1.5 x 1.8 cm, with the isthmus measuring 5.1 mm.  A hypo echoic nodules noted in the upper pole left lobe of 5 x 2 x 5 mm. Posterior to the inferior right lobe there is a solid nodule of 1.4 x 0.8 x 0.9 cm.  This probably represents a thyroid nodule but an adjacent nodule such as parathyroid adenoma would be difficult to exclude.   IMPRESSION:   1.  Thyroid gland somewhat elongate but otherwise normal in size. 2.  1.4 cm nodule from inferior posterior aspect of the right lobe may represent thyroid nodule but adjacent parathyroid adenoma cannot be excluded.  Tony Kim  Summary/Assessment: In the setting of leukopenia, without anemia or thrombocytopenia, he presents now for further diagnostic and therapeutic recommendations.  Tony Kim is a 62 year old resident of [place], generally from [place], whose past medical history is significant for allergic rhinitis; benign thyroid nodule; untreated dyslipidemia; detached retina; elevated PSA status post prostate biopsy in 2017; mild benign prostatic hypertrophy; and   Since the 1980s, he was aware that he had hepatitis B. On December 03, 2016 his hepatitis A antibody was reactive.  The hepatitis D antibody was negative.  The hepatitis C antibody was nonreactive.  The hepatitis B surface antigen was reactive.  The hepatitis Be antibody was positive.  HIV 1 and HIV 2  were nonreactive.  The quantitative HBV DNA by PCR assay was 82,400,000. Although he had a screening colonoscopy  in early 2019, under the direction of Dr.Patrick Benson Norway, gastroenterology, at [facility] and treatment of his hepatitis B was recommended, Tony Kim failed to follow-up on the recommendation, however.  His mother ironically died of complications of hepatitis B at the age of 39 years.  His primary care physician is Dr. Deland Pretty.  There is no other known hepatitis B infection in his immediate family.  He is alone at this first visit.   A review of his laboratory studies from December 31, 2012 reveal hemoglobin 14.0 hematocrit 42.1 MCV 84.4 MCH 28.1 RDW 13.3 WBC 3.6; platelets 189,000.  On January 06, 2014 a complete blood count showed hemoglobin 12.5 hematocrit 37.5 WBC 3.3; platelets 205,000.  On August 19, 2015 complete blood count showed hemoglobin 12.8 hematocrit 39.4 WBC 3.8; platelets 205,000.  On December 03, 2016 complete blood count showed hemoglobin 12.0 hematocrit 36.2 WBC 3.4; platelets 179,000.  A comprehensive metabolic panel on December 03, 2016 was normal except for calcium 8.3 SGOT 85 SGPT 54.  Tony Kim has no essential hypertension, coronary artery disease, or cardiac dysrhythmia.  His blood pressure, however, was elevated on 2 separate occasions in the Hematology Clinic.  He does not have an ambulatory blood pressure kit at home.  He reports no seizure disorder or stroke syndrome.  Has a known benign thyroid nodule which is followed closely.  He denies peptic ulcer or gastroesophageal reflux disease.  He has no rheumatoid or gouty arthritis.  Because his PSA was elevated and he complains even now of urinary urgency (likely benign prostatic hypertrophy) his prostate biopsy showed no malignancy in 2017.  He takes no medication.  He is followed by Dr. Nicolette Bang, urologic surgery.  His most recent PSA was reportedly 8.67.  He has no peripheral arterial venous thromboembolic  disease.  In his family, there is no history of any blood disorder or bleeding tendency.  Clinically both his appetite and weight remain stable. He has no unusual headache, dizziness, lightheadedness, syncope, or near syncopal episodes.  He has no rash or itching. Although he had a detached retina on the right, surgically repaired 2 years earlier, he still has some visual changes.  He reports no hearing deficit.  He reports no pain or difficulty in swallowing.  He has no unusual cough, sore throat, or orthopnea.  He denies dyspnea either at rest or on exertion.  No fever, shaking chills, sweats, or flulike symptoms are evident.  He has no chest or abdominal pain.  He reports no heartburn or indigestion.  There is no nausea, vomiting, diarrhea, or constipation.  He moves his bowels 1-2 times daily.  He has no inflammatory bowel disease or symptomatic diverticulosis.  He denies melena or bright red blood per rectum.  He has no urinary frequency, hematuria, or dysuria.  There is no bone, joint, muscle pain.  He denies any bleeding tendency or easy bruisability.  There is no numbness or tingling in the fingers or toes.   His other comorbid problems include allergic rhinitis; benign thyroid nodule; untreated dyslipidemia; repaired detached retina; elevated PSA status post prostate biopsy in 2017; and mild benign prostatic hypertrophy.  Recommendation/Plan: I had a lengthy discussion with evidence regarding his prior laboratory studies and persistent leukopenia and mild neutropenia without an absolute eosinophilia.  We discussed also in extensive detail his history with hepatitis B.  Although he was aware of his past diagnosis in the 1980s, and there is a vague history of some form of "treatment," at least within  the past 5-10 years, those records are not available for review.  He is not aware of how he contracted hepatitis B. His HIV serology was nonreactive.  His hepatitis B profile was previously reactive as  well as the hepatitis A antibody. He has no reactive serology for hepatitis C.  As mentioned above, however, his hepatitis B quantitative DNA PCR was strongly positive (82,400,000).  This likely suggests active infection.  This by itself could explain the leukopenia/neutropenia.  He has no eosinophilia.  The eosinophil percentage is relatively elevated while the absolute eosinophil count is <400.  A battery of laboratory studies were obtained to exclude any potential confounding factors contributing to his leukopenia.  Those factors include, but are not exclusive of, nutritional deficiencies, anabolic anomaly, and copper deficiency  His peripheral blood smear will be reviewed officially by hematopathology.  Because of the correlation between hepatitis B and hepatocellular carcinoma, an alpha-fetoprotein was requested.  In addition, an ultrasound of the abdomen to exclude either space-occupying lesions within the liver and/or splenomegaly was requested.    Because he has previously been seen by Dr. Carol Ada, gastroenterology, whom he likes, it was strongly recommended that he follow-up to address the hepatitis B serology and quantitative PCR.  At the present he declined until the results of today's laboratory studies are available. He has given [place] permission, however, to forward a copy of this initial evaluation to Dr. Benson Norway, for his review.  He assured me that he is willing to follow-up on subsequent treatment as appropriate.  Barring any unforeseen complications, his next scheduled doctor appointment to discuss the results of his abdominal ultrasound and laboratory investigation is on October 22.  He was advised to call us in the interim should any new or untoward problems arise.  The total time spent discussing the leukopenia/neutropenia, reviewing his prior laboratory studies, methodology for evaluating leukopenia/neutropenia, the implications of active untreated hepatitis B, role and rationale  for an abdominal ultrasound and AFP, preliminary considerations and recommendations was 60 minutes.  At least 50% of that time was spent in discussion, reviewing outside records, laboratory evaluation, counseling, and answering questions. All questions were answered to his satisfaction.  He knows to call the Clinic with any problems, questions, or concerns.  This note was dictated using voice activated technology/software.  Unfortunately, typographical errors are not uncommon, and transcription is subject to mistakes and regrettably misinterpretation.  If necessary, clarification of the above information can be discussed with me at any time.  Thank you Dr. Shelia Media for allowing my participation in the care of Tony Kim. I will keep you closely informed as the results of his preliminary laboratory data become available.  Please do not hesitate to call should any questions arise regarding this initial consultation and discussion.  FOLLOW UP: AS DIRECTED   cc:     Deland Pretty, MD           Carol Ada, MD           Nicolette Bang, MD   Henreitta Leber, MD  Hematology/Oncology [place] [address] [place], [place] [address] Office: [phone] LOVF: [phone]

## 2017-12-06 LAB — AFP TUMOR MARKER: AFP, SERUM, TUMOR MARKER: 5.1 ng/mL (ref 0.0–8.3)

## 2017-12-06 LAB — COPPER, SERUM: Copper: 106 ug/dL (ref 72–166)

## 2017-12-09 ENCOUNTER — Ambulatory Visit (HOSPITAL_COMMUNITY)
Admission: RE | Admit: 2017-12-09 | Discharge: 2017-12-09 | Disposition: A | Discharge status: Home / Self Care | Payer: BLUE CROSS/BLUE SHIELD | Source: Ambulatory Visit | Attending: Hematology and Oncology | Admitting: Hematology and Oncology

## 2017-12-09 ENCOUNTER — Telehealth: Payer: Self-pay | Admitting: Emergency Medicine

## 2017-12-09 DIAGNOSIS — B1910 Unspecified viral hepatitis B without hepatic coma: Secondary | ICD-10-CM | POA: Diagnosis not present

## 2017-12-09 DIAGNOSIS — K824 Cholesterolosis of gallbladder: Secondary | ICD-10-CM | POA: Diagnosis not present

## 2017-12-09 DIAGNOSIS — D72819 Decreased white blood cell count, unspecified: Secondary | ICD-10-CM | POA: Insufficient documentation

## 2017-12-09 LAB — PATHOLOGIST SMEAR REVIEW

## 2017-12-09 NOTE — Telephone Encounter (Signed)
Faxed 12/05/17 visit with Md Audelia Hives to MD Shelia Media and MD McKenzie.  Both faxes received

## 2017-12-10 LAB — HEPATITIS B DNA, ULTRAQUANTITATIVE, PCR

## 2017-12-10 LAB — HBV REAL-TIME PCR, QUANT
HBV AS IU/ML: 88200000 IU/mL
LOG10 HBV AS IU/ML: 7.945 log10 IU/mL

## 2017-12-23 NOTE — Progress Notes (Signed)
[facility] Outpatient Progress Note  Patient Name:  Tony Kim  DOB: October 07, 1955   Date of Service: December 24, 2017  Referring Provider: Deland Pretty, [address] [place], [place] [address]   Consulting Physician: Henreitta Leber, MD Hematology/Oncology  Reason for Visit: In the setting of leukopenia/neutropenia, without anemia or thrombocytopenia, he presents now for the results of his preliminary evaluation with recommendations.  Brief History: Gustaf Mccarter is a 62 year old resident of [place], originally from [place], whose past medical history is significant for allergic rhinitis; benign thyroid nodule; untreated dyslipidemia; repaired detached retina; elevated PSA status post prostate biopsy in 2017; mild benign prostatic hypertrophy; and untreated hepatitis B identified initially in the 1980s by report. On December 03, 2016 his hepatitis A antibody was reactive.  The hepatitis D antibody was negative.  The hepatitis C antibody was nonreactive.  The hepatitis B surface antigen was reactive.  The hepatitis Be antibody was positive.  HIV 1 and HIV 2 were nonreactive.  The quantitative HBV DNA by PCR assay was 82,400,000.   Although he had a screening colonoscopy in early 2019, under the direction of Dr.Patrick Benson Norway, gastroenterology, at [facility] and treatment of his hepatitis B was recommended, [place] failed to follow-up on the recommendation, however.  His mother ironically died of complications of hepatitis B at the age of 65 years.  At the time of his last visit, his blood pressure was elevated on 2 separate occasions in the Clinic.  He does not have an ambulatory blood pressure kit.  His PSA was elevated and he complained of urinary urgency. A prostate biopsy showed no malignancy in 2017. His primary care physician is Dr. Deland Pretty.  He is accompanied by his wife Sherren at this visit.  A review of  his laboratory studies from December 31, 2012 reveal hemoglobin 14.0 hematocrit 42.1 MCV 84.4 MCH 28.1 RDW 13.3 WBC 3.6; platelets 189,000.  On January 06, 2014 a complete blood count showed hemoglobin 12.5 hematocrit 37.5 WBC 3.3; platelets 205,000.  On August 19, 2015 complete blood count showed hemoglobin 12.8 hematocrit 39.4 WBC 3.8; platelets 205,000.  On December 03, 2016 complete blood count showed hemoglobin 12.0 hematocrit 36.2 WBC 3.4; platelets 179,000.  A comprehensive metabolic panel on December 03, 2016 was normal except for calcium 8.3 SGOT 85 SGPT 54. In his family there is no history of any blood disorder or bleeding tendency.  At the time of his initial visit on October 3, a battery of laboratory studies were obtained to exclude any potential factors contributing to his leukopenia.  Those factors include, but are not exclusive of, nutritional deficiencies,  metabolic anomaly, and copper deficiency.  His peripheral blood smear was reviewed by hematopathology. Because of the correlation between hepatitis B and hepatocellular carcinoma, an alpha-fetoprotein was requested.  In addition, an ultrasound of the abdomen to exclude either space-occupying lesions within the liver and/or splenomegaly was requested.  In short, the examination was normal without evidence of hepatosplenomegaly. Those results are detailed below.  Because he has previously been seen by Dr. Carol Ada, gastroenterology, it was strongly recommended that he follow-up to address the hepatitis B serology and quantitative PCR.  He wanted to wait until the results of his laboratory studies were available. He had given [place] permission, to forward a copy of this initial evaluation to Dr. Benson Norway, for his review.  He assured me that he is willing to follow-up on subsequent treatment as appropriate.  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  Interval History: In the interim since his last visit, both his appetite and weight remain stable. He has no unusual headache, dizziness, lightheadedness, syncope, or near syncopal episodes.  He has no rash or itching. Although he had a detached retina on the right, surgically repaired 2 years earlier, he still has some visual changes.  He reports no hearing deficit.  He reports no pain or difficulty in swallowing.  He has no unusual cough, sore throat, or orthopnea.  He denies dyspnea either at rest or on exertion.  No fever, shaking chills, sweats, or flulike symptoms are evident.  He has no chest or abdominal pain.  He reports no heartburn or indigestion.  There is no nausea, vomiting, diarrhea, or constipation.  He moves his bowels 1-2 times daily.  He has no inflammatory bowel disease or symptomatic diverticulosis.  He denies melena or bright red blood per rectum.  He has no urinary frequency, hematuria, or dysuria.  There is no bone, joint, muscle pain.  He denies any bleeding tendency or easy bruisability.  There is no numbness or tingling in the fingers or toes.  Past Medical History Reviewed        Family History Reviewed       Social History Reviewed  Allergies:  He has no known medical allergies He has no food allergies He has nonspecific seasonal allergies  Current Medications: Omega-3 fish oils: Takes sporadically.  Review of Systems: Constitutional: No fever, sweats, or shaking chills.  No appetite or weight deficit; energy level stable. Skin: No rash, scaling, sores, lumps, or jaundice. HEENT: No new visual changes or hearing deficit. Pulmonary: No unusual cough, sore throat, or orthopnea; without DOE or COPD. Cardiovascular: No coronary artery disease, angina, or myocardial infarction.  No cardiac dysrhythmia, essential hypertension; dyslipidemia. Gastrointestinal: No indigestion, dysphagia, abdominal pain, diarrhea, or  constipation.  No change in bowel habits; reactive hepatitis B serology and elevated quantitative PCR identified. Genitourinary: No urinary frequency, hematuria, or dysuria.  Urinary urgency; BPH.  Elevated PSA followed by Dr. McKenzie. Musculoskeletal: No arthralgias or myalgias; no joint swelling, pain, or instability. Hematologic: No bleeding tendency or easy bruisability; leukopenia; relative eosinophilia without absolute eosinophilia. Endocrine: No intolerance to heat or cold; benign thyroid nodule; no diabetes mellitus. Vascular: No peripheral arterial or venous thromboembolic disease. Psychological: No anxiety, depression, or mood changes; no mental health illnesses. Neurological: No dizziness, lightheadedness, syncope, or near syncopal episodes; no numbness or tingling in the fingers or toes.  Physical Examination: Vital Signs: Body surface area is 1.97 meters squared.  Vitals:   12/24/17 1032  BP: (!) 164/86  Pulse: 81  Resp: 18  Temp: 98.9 F (37.2 C)  SpO2: 100%    Filed Weights   12/24/17 1032  Weight: 184 lb 9.6 oz (83.7 kg)  Body mass index is 29.8 kg/m. Constitutional:  Selvin Barbar is a fully nourished and developed African American from [place] originally.  He looks age appropriate.  He is friendly and cooperative without respiratory compromise at rest. Skin: No rashes, scaling, dryness, jaundice, or itching. HEENT: Head is normocephalic and atraumatic.  Pupils are equal round and reactive to light and accommodation.  Sclerae are anicteric.  Bilateral arcus senilis.  Conjunctivae are pink.  No sinus tenderness nor oropharyngeal lesions.  Lips without cracking or peeling; tongue without   mass, inflammation, or nodularity.  Mucous membranes are moist. Neck: Supple and symmetric.  No jugular venous distention or thyromegaly.  Trachea is midline. Lymphatics: No cervical or supraclavicular lymphadenopathy.  No epitrochlear, axillary, or inguinal lymphadenopathy is  appreciated. Respiratory/chest: Thorax is symmetrical.  Breath sounds are clear to auscultation and percussion.  Normal excursion and respiratory effort. Back: Symmetric without deformity or tenderness. Cardiovascular: Heart rate and rhythm are regular without murmurs or gallops.  Gastrointestinal: Abdomen is soft, nontender; no organomegaly.  Bowel sounds are normoactive.  No masses are appreciated. Genitourinary: Normal external male genitalia. Rectal examination: Not performed. Extremities: In the lower extremities, there is no asymmetric swelling, erythema, tenderness, or cord formation.  No clubbing, cyanosis, nor edema. Hematologic: No petechiae, hematomas, or ecchymoses. Psychological:  He is oriented to person, place, and time; normal affect, memory, and cognition. Neurological: There are no gross neurologic deficits.  Laboratory Results: December 05, 2017  Ref Range & Units 2wk ago  WBC Count 4.0 - 10.3 K/uL 3.0Low    RBC 4.20 - 5.82 MIL/uL 4.90   Hemoglobin 13.0 - 17.1 g/dL 13.4   HCT 38.4 - 49.9 % 40.7   MCV 79.3 - 98.0 fL 83.1   MCH 27.2 - 33.4 pg 27.3   MCHC 32.0 - 36.0 g/dL 32.9   RDW 11.0 - 14.6 % 14.1   Platelet Count 140 - 400 K/uL 203   Neutrophils Relative % % 37   Neutro Abs 1.5 - 6.5 K/uL 1.1Low    Lymphocytes Relative % 45   Lymphs Abs 0.9 - 3.3 K/uL 1.3   Monocytes Relative % 5   Monocytes Absolute 0.1 - 0.9 K/uL 0.2   Eosinophils Relative % 12   Eosinophils Absolute 0.0 - 0.5 K/uL 0.4   Basophils Relative % 1   Basophils Absolute 0.0 - 0.1 K/uL 0.0     CBC Latest Ref Rng & Units 12/05/2017 12/03/2016  WBC 4.0 - 10.3 K/uL 3.0(L) 3.4(L)  Hemoglobin 13.0 - 17.1 g/dL 13.4 12.0(L)  Hematocrit 38.4 - 49.9 % 40.7 36.2(L)  Platelets 140 - 400 K/uL 203 179    CMP Latest Ref Rng & Units 12/05/2017 12/03/2016 12/03/2016  Glucose 70 - 99 mg/dL 90 134(H) -  BUN 8 - 23 mg/dL 15 12 -  Creatinine 0.61 - 1.24 mg/dL 1.16 1.01 -  Sodium 135 - 145 mmol/L 141 139 -   Potassium 3.5 - 5.1 mmol/L 4.0 4.1 -  Chloride 98 - 111 mmol/L 105 102 -  CO2 22 - 32 mmol/L 30 27 -  Calcium 8.9 - 10.3 mg/dL 9.0 8.3(L) -  Total Protein 6.5 - 8.1 g/dL 7.1 6.1 -  Total Bilirubin 0.3 - 1.2 mg/dL 1.2 1.1 -  Alkaline Phos 38 - 126 U/L 63 - -  AST 15 - 41 U/L 23 85(H) -  ALT 0 - 44 U/L 38 54(H) 51(H)  Alpha-fetoprotein 5.1 ESR 6 LDH 194 (98-192) Reticulocyte count 0.7% Serum copper 106 CRP <0.8 Vitamin B12 460 Folic acid 12.1  Ref Range & Units 2wk ago  HBV AS IU/ML IU/mL 88,200,000   LOG10 HBV AS IU/ML log10 IU/mL 7.945   Comment: (NOTE)  Performed At: [facility]  [address] [place], [place] [id number]  Nagendra Sanjai MD Ph:[phone]    Diagnostic/Imaging Studies: ABDOMEN ULTRASOUND COMPLETE  COMPARISON:  None.  FINDINGS: Gallbladder: 2 mm gallbladder polyp. No gallstones, wall thickening or pericholecystic fluid.  Common bile duct: Diameter: 3.0 mm  Liver: No focal lesion identified. Within normal   limits in parenchymal echogenicity. Portal vein is patent on color Doppler imaging with normal direction of blood flow towards the liver.  IVC: No abnormality visualized.  Pancreas: Visualized portion unremarkable.  Spleen: Size and appearance within normal limits.  Right Kidney: Length: 10.7 cm. Echogenicity within normal limits. No mass or hydronephrosis visualized.  Left Kidney: Length: 11.0 cm. Echogenicity within normal limits. No mass or hydronephrosis visualized.  Abdominal aorta: No aneurysm visualized.  Other findings: None.  IMPRESSION: 2 mm gallbladder polyp. Otherwise, normal examination. No evidence of hepatosplenomegaly.   Electronically Signed   By: Steven  Reid M.D.   On: 12/09/2017 15:56  June 17, 2007  ULTRASOUND OF HEAD/NECK SOFT TISSUES  Technique: Ultrasound examination of the head and neck soft tissues was performed in the area of clinical concern.  Comparison: None  Findings:  The thyroid gland is somewhat elongate but otherwise normal in size. The right lobe measures 6.6 cm sagittally with a depth of 1.6 cm and width of 1.9 cm. The left lobe measures 5.9 x 1.5 x 1.8 cm, with the isthmus measuring 5.1 mm. A hypo echoic nodules noted in the upper pole left lobe of 5 x 2 x 5 mm. Posterior to the inferior right lobe there is a solid nodule of 1.4 x 0.8 x 0.9 cm. This probably represents a thyroid nodule but an adjacent nodule such as parathyroid adenoma would be difficult to exclude.  IMPRESSION: 1. Thyroid gland somewhat elongate but otherwise normal in size. 2. 1.4 cm nodule from inferior posterior aspect of the right lobe may represent thyroid nodule but adjacent parathyroid adenoma cannot be excluded.  Angela Corum  Summary/Assessment: In the setting of leukopenia/neutropenia, without anemia or thrombocytopenia, he presents now for the results of his preliminary evaluation with recommendations.  A review of his laboratory studies from December 31, 2012 reveal hemoglobin 14.0 hematocrit 42.1 MCV 84.4 MCH 28.1 RDW 13.3 WBC 3.6; platelets 189,000.  On January 06, 2014 a complete blood count showed hemoglobin 12.5 hematocrit 37.5 WBC 3.3; platelets 205,000.  On August 19, 2015 complete blood count showed hemoglobin 12.8 hematocrit 39.4 WBC 3.8; platelets 205,000.  On December 03, 2016 complete blood count showed hemoglobin 12.0 hematocrit 36.2 WBC 3.4; platelets 179,000.  A comprehensive metabolic panel on December 03, 2016 was normal except for calcium 8.3 SGOT 85 SGPT 54. In his family there is no history of any blood disorder or bleeding tendency.  At the time of his initial visit on October 3, a battery of laboratory studies were obtained to exclude any potential factors contributing to his leukopenia.  Those factors include, but are not exclusive of, nutritional deficiencies,  metabolic anomaly, and copper deficiency.  His peripheral blood smear was reviewed by  hematopathology. Because of the correlation between hepatitis B and hepatocellular carcinoma, an alpha-fetoprotein was requested.  In addition, an ultrasound of the abdomen to exclude either space-occupying lesions within the liver and/or splenomegaly was requested.  In short, the examination was normal without evidence of hepatosplenomegaly. Those results are detailed below.  Although he had a screening colonoscopy in early 2019, under the direction of Dr.Patrick Hung, gastroenterology, at [facility] and treatment of his hepatitis B was recommended, [place] failed to follow-up on the recommendation, however.  His mother ironically died of complications of hepatitis B at the age of 86 years.  His PSA was elevated and he complained of urinary urgency. A prostate biopsy showed no malignancy in 2017.   In the interim since his last visit,   both his appetite and weight remain stable. He has no unusual headache, dizziness, lightheadedness, syncope, or near syncopal episodes.  He has no rash or itching. Although he had a detached retina on the right, surgically repaired 2 years earlier, he still has some visual changes.  He reports no hearing deficit.  He reports no pain or difficulty in swallowing.  He has no unusual cough, sore throat, or orthopnea.  He denies dyspnea either at rest or on exertion.  No fever, shaking chills, sweats, or flulike symptoms are evident.  He has no chest or abdominal pain.  He reports no heartburn or indigestion.  There is no nausea, vomiting, diarrhea, or constipation.  He moves his bowels 1-2 times daily.  He has no inflammatory bowel disease or symptomatic diverticulosis.  He denies melena or bright red blood per rectum.  He has no urinary frequency, hematuria, or dysuria.  There is no bone, joint, muscle pain.  He denies any bleeding tendency or easy bruisability.  There is no numbness or tingling in the fingers or toes.  His other comorbid problems include allergic  rhinitis; benign thyroid nodule; untreated dyslipidemia; repaired detached retina; elevated PSA status post prostate biopsy in 2017; mild benign prostatic hypertrophy; and untreated hepatitis B identified initially in the 1980s by report. On December 03, 2016 his hepatitis A antibody was reactive.  The hepatitis D antibody was negative.  The hepatitis C antibody was nonreactive.  The hepatitis B surface antigen was reactive.  The hepatitis Be antibody was positive.  HIV 1 and HIV 2 were nonreactive.  The quantitative HBV DNA by PCR assay was 82,400,000.   Recommendation/Plan: We discussed in detail the results of his initial laboratory evaluation from October 3.  Those results are outlined above.  In short, there is no evidence of nutritional deficiency, copper deficit, or metabolic anomaly.  The hepatitis B quantitative PCR was markedly elevated.  The results of the AFB and abdominal ultrasound did not suggest any overt liver pathology/tumor or mass-effect.  Those results are detailed above.  Because he has previously been seen by Dr. Carol Ada, gastroenterology, it was strongly recommended that he follow-up to address the hepatitis B serology and quantitative PCR.  He wanted to wait until the results of his laboratory studies were available. He had given [place] permission, to forward a copy of this initial evaluation to Dr. Benson Norway, for his review.  He assured me that he is willing to follow-up on subsequent treatment as appropriate.  He was advised to call Dr. Ulyses Amor office for an appointment.  It was recommended that he obtain an ambulatory blood pressure kit and begin to document his blood pressure.  If his blood pressure continues to be persistently elevated, then treatment for hypertension under the direction of Dr. Shelia Media is recommended.  It is possible that the hepatitis B is responsible for his leukopenia.  People of his descent (African) can have a lower than normal white blood cell count compared to  Caucasians simply on the basis of genetics.  Although we did discuss the possibility of a bone marrow aspiration and biopsy, it was agreed that until his hepatitis B is treated successfully, and/or there is evidence of a decrease in absolute white blood cell count, we would defer that test unless necessary.  Barring any unforeseen complications, his next scheduled doctor visit with laboratory studies is on March 18, 2018.  It was strongly recommended that he stay current on all of his immunizations through his primary care office  especially the seasonal influenza vaccine.  He was advised to call us in the interim should any new or untoward problems arise.  The total time spent discussing and reviewing his laboratory studies, the results of his abdominal ultrasound, the implications of active untreated hepatitis B, and recommendations was 25 minutes.  At least 50% of that time was spent in discussion, counseling, and answering questions. All questions were answered to his satisfaction.   This note was dictated using voice activated technology/software.  Unfortunately, typographical errors are not uncommon, and transcription is subject to mistakes and regrettably misinterpretation.  If necessary, clarification of the above information can be discussed with me at any time.  FOLLOW UP: AS DIRECTED   cc:     Walter Pharr, MD           Patrick Hung, MD           Patrick McKenzie, MD    H , MD  Hematology/Oncology [facility] [address] [place], [place] [address] Office: [phone] Main: [phone] 

## 2017-12-24 ENCOUNTER — Inpatient Hospital Stay (HOSPITAL_BASED_OUTPATIENT_CLINIC_OR_DEPARTMENT_OTHER): Payer: BLUE CROSS/BLUE SHIELD | Admitting: Hematology and Oncology

## 2017-12-24 ENCOUNTER — Encounter: Payer: Self-pay | Admitting: Hematology and Oncology

## 2017-12-24 VITALS — BP 164/86 | HR 81 | Temp 98.9°F | Resp 18 | Ht 66.0 in | Wt 184.6 lb

## 2017-12-24 DIAGNOSIS — B1910 Unspecified viral hepatitis B without hepatic coma: Secondary | ICD-10-CM

## 2017-12-24 DIAGNOSIS — R041 Hemorrhage from throat: Secondary | ICD-10-CM | POA: Diagnosis not present

## 2017-12-24 DIAGNOSIS — D709 Neutropenia, unspecified: Secondary | ICD-10-CM | POA: Diagnosis not present

## 2017-12-24 DIAGNOSIS — K219 Gastro-esophageal reflux disease without esophagitis: Secondary | ICD-10-CM | POA: Diagnosis not present

## 2017-12-24 DIAGNOSIS — D72819 Decreased white blood cell count, unspecified: Secondary | ICD-10-CM | POA: Insufficient documentation

## 2017-12-24 DIAGNOSIS — E785 Hyperlipidemia, unspecified: Secondary | ICD-10-CM | POA: Diagnosis not present

## 2017-12-24 DIAGNOSIS — N400 Enlarged prostate without lower urinary tract symptoms: Secondary | ICD-10-CM

## 2017-12-24 DIAGNOSIS — Z8619 Personal history of other infectious and parasitic diseases: Secondary | ICD-10-CM | POA: Diagnosis not present

## 2017-12-24 NOTE — Patient Instructions (Signed)
We discussed in detail the results of your laboratory studies from October 3.  There was no evidence of nutritional deficiency, metabolic abnormality, or liver dysfunction.  Those results, however, suggest active hepatitis B infection.  It is recommended that you follow-up with Dr. Carol Ada, gastroenterology, to discuss treatment options.  The results of your complete blood count showed a normal hemoglobin and platelet count.  The white blood cell count was 3.0.  Although it is possible to perform a bone marrow aspiration and biopsy, I would defer that procedure unless the white count decreases after the treatment for hepatitis B.  It is recommended that you stay up-to-date and current with all of your immunizations, including the seasonal influenza vaccine.  Barring any unforeseen complications, a follow-up visit with blood work is requested on January 14.  Please do not hesitate to call if any questions arise in the interim.  Copies of our laboratory studies and dictation have been forwarded to all of your consultants.  Thank you! Ladona Ridgel, MD Hematology/Oncology

## 2018-01-20 DIAGNOSIS — H35371 Puckering of macula, right eye: Secondary | ICD-10-CM | POA: Diagnosis not present

## 2018-01-20 DIAGNOSIS — H26491 Other secondary cataract, right eye: Secondary | ICD-10-CM | POA: Diagnosis not present

## 2018-01-20 DIAGNOSIS — Z961 Presence of intraocular lens: Secondary | ICD-10-CM | POA: Diagnosis not present

## 2018-01-20 DIAGNOSIS — H2512 Age-related nuclear cataract, left eye: Secondary | ICD-10-CM | POA: Diagnosis not present

## 2018-03-12 ENCOUNTER — Telehealth: Payer: Self-pay | Admitting: Internal Medicine

## 2018-03-12 NOTE — Telephone Encounter (Signed)
RR out - moved 1/14 appointment to 1/24 with [facility]. Patient contacted by office (mx). Per patient cancel appointment.  he needs to have another appointment done before following up here and he will call when ready.

## 2018-03-18 ENCOUNTER — Inpatient Hospital Stay: Payer: BLUE CROSS/BLUE SHIELD | Admitting: Hematology and Oncology

## 2018-03-18 ENCOUNTER — Inpatient Hospital Stay: Payer: BLUE CROSS/BLUE SHIELD

## 2018-03-28 ENCOUNTER — Ambulatory Visit: Payer: BLUE CROSS/BLUE SHIELD | Admitting: Internal Medicine

## 2018-03-28 ENCOUNTER — Other Ambulatory Visit: Payer: BLUE CROSS/BLUE SHIELD

## 2018-07-01 ENCOUNTER — Other Ambulatory Visit: Payer: Self-pay | Admitting: Internal Medicine

## 2018-07-01 DIAGNOSIS — E041 Nontoxic single thyroid nodule: Secondary | ICD-10-CM

## 2018-07-30 ENCOUNTER — Other Ambulatory Visit: Payer: BLUE CROSS/BLUE SHIELD

## 2018-11-24 DIAGNOSIS — E7800 Pure hypercholesterolemia, unspecified: Secondary | ICD-10-CM | POA: Diagnosis not present

## 2018-11-24 DIAGNOSIS — Z86010 Personal history of colonic polyps: Secondary | ICD-10-CM | POA: Diagnosis not present

## 2018-11-24 DIAGNOSIS — Z0000 Encounter for general adult medical examination without abnormal findings: Secondary | ICD-10-CM | POA: Diagnosis not present

## 2018-11-24 DIAGNOSIS — Z125 Encounter for screening for malignant neoplasm of prostate: Secondary | ICD-10-CM | POA: Diagnosis not present

## 2018-11-24 DIAGNOSIS — D72819 Decreased white blood cell count, unspecified: Secondary | ICD-10-CM | POA: Diagnosis not present

## 2018-12-03 DIAGNOSIS — B181 Chronic viral hepatitis B without delta-agent: Secondary | ICD-10-CM | POA: Diagnosis not present

## 2018-12-09 ENCOUNTER — Other Ambulatory Visit: Payer: Self-pay | Admitting: Gastroenterology

## 2018-12-09 DIAGNOSIS — Z8619 Personal history of other infectious and parasitic diseases: Secondary | ICD-10-CM

## 2018-12-15 ENCOUNTER — Ambulatory Visit
Admission: RE | Admit: 2018-12-15 | Discharge: 2018-12-15 | Disposition: A | Discharge status: Home / Self Care | Payer: BC Managed Care – PPO | Source: Ambulatory Visit | Attending: Gastroenterology | Admitting: Gastroenterology

## 2018-12-15 DIAGNOSIS — Z8619 Personal history of other infectious and parasitic diseases: Secondary | ICD-10-CM

## 2018-12-15 DIAGNOSIS — B1910 Unspecified viral hepatitis B without hepatic coma: Secondary | ICD-10-CM | POA: Diagnosis not present

## 2018-12-15 DIAGNOSIS — K824 Cholesterolosis of gallbladder: Secondary | ICD-10-CM | POA: Diagnosis not present

## 2018-12-16 DIAGNOSIS — I10 Essential (primary) hypertension: Secondary | ICD-10-CM | POA: Diagnosis not present

## 2018-12-23 DIAGNOSIS — I10 Essential (primary) hypertension: Secondary | ICD-10-CM | POA: Diagnosis not present

## 2018-12-31 DIAGNOSIS — R7989 Other specified abnormal findings of blood chemistry: Secondary | ICD-10-CM | POA: Diagnosis not present

## 2018-12-31 DIAGNOSIS — I10 Essential (primary) hypertension: Secondary | ICD-10-CM | POA: Diagnosis not present

## 2019-01-28 DIAGNOSIS — I10 Essential (primary) hypertension: Secondary | ICD-10-CM | POA: Diagnosis not present

## 2019-01-28 DIAGNOSIS — E7800 Pure hypercholesterolemia, unspecified: Secondary | ICD-10-CM | POA: Diagnosis not present

## 2019-01-28 DIAGNOSIS — R7989 Other specified abnormal findings of blood chemistry: Secondary | ICD-10-CM | POA: Diagnosis not present

## 2019-02-19 DIAGNOSIS — Z20828 Contact with and (suspected) exposure to other viral communicable diseases: Secondary | ICD-10-CM | POA: Diagnosis not present

## 2019-02-25 ENCOUNTER — Other Ambulatory Visit: Payer: Self-pay | Admitting: Internal Medicine

## 2019-02-25 DIAGNOSIS — E041 Nontoxic single thyroid nodule: Secondary | ICD-10-CM

## 2019-03-11 ENCOUNTER — Ambulatory Visit
Admission: RE | Admit: 2019-03-11 | Discharge: 2019-03-11 | Disposition: A | Discharge status: Home / Self Care | Payer: BC Managed Care – PPO | Source: Ambulatory Visit | Attending: Internal Medicine | Admitting: Internal Medicine

## 2019-03-11 DIAGNOSIS — E041 Nontoxic single thyroid nodule: Secondary | ICD-10-CM | POA: Diagnosis not present

## 2019-03-26 DIAGNOSIS — R350 Frequency of micturition: Secondary | ICD-10-CM | POA: Diagnosis not present

## 2019-03-26 DIAGNOSIS — R972 Elevated prostate specific antigen [PSA]: Secondary | ICD-10-CM | POA: Diagnosis not present

## 2019-03-27 ENCOUNTER — Other Ambulatory Visit: Payer: Self-pay | Admitting: Urology

## 2019-03-27 DIAGNOSIS — R972 Elevated prostate specific antigen [PSA]: Secondary | ICD-10-CM

## 2019-04-18 ENCOUNTER — Other Ambulatory Visit: Payer: Self-pay

## 2019-04-18 ENCOUNTER — Ambulatory Visit
Admission: RE | Admit: 2019-04-18 | Discharge: 2019-04-18 | Disposition: A | Discharge status: Home / Self Care | Payer: BC Managed Care – PPO | Source: Ambulatory Visit | Attending: Urology | Admitting: Urology

## 2019-04-18 DIAGNOSIS — R972 Elevated prostate specific antigen [PSA]: Secondary | ICD-10-CM | POA: Diagnosis not present

## 2019-04-18 MED ORDER — GADOBENATE DIMEGLUMINE 529 MG/ML IV SOLN
17.0000 mL | Freq: Once | INTRAVENOUS | Status: AC | PRN
  Administered 2019-04-18: 15:00:00 17 mL via INTRAVENOUS

## 2019-04-24 DIAGNOSIS — R972 Elevated prostate specific antigen [PSA]: Secondary | ICD-10-CM | POA: Diagnosis not present

## 2019-05-19 DIAGNOSIS — C61 Malignant neoplasm of prostate: Secondary | ICD-10-CM | POA: Diagnosis not present

## 2019-05-19 DIAGNOSIS — N411 Chronic prostatitis: Secondary | ICD-10-CM | POA: Diagnosis not present

## 2019-05-19 DIAGNOSIS — R972 Elevated prostate specific antigen [PSA]: Secondary | ICD-10-CM | POA: Diagnosis not present

## 2019-05-19 DIAGNOSIS — N410 Acute prostatitis: Secondary | ICD-10-CM | POA: Diagnosis not present

## 2019-06-02 DIAGNOSIS — C61 Malignant neoplasm of prostate: Secondary | ICD-10-CM | POA: Diagnosis not present

## 2019-06-16 DIAGNOSIS — C61 Malignant neoplasm of prostate: Secondary | ICD-10-CM | POA: Diagnosis not present

## 2019-06-16 DIAGNOSIS — R350 Frequency of micturition: Secondary | ICD-10-CM | POA: Diagnosis not present

## 2019-06-17 ENCOUNTER — Other Ambulatory Visit: Payer: Self-pay | Admitting: Urology

## 2019-07-08 DIAGNOSIS — R350 Frequency of micturition: Secondary | ICD-10-CM | POA: Diagnosis not present

## 2019-07-08 DIAGNOSIS — M62838 Other muscle spasm: Secondary | ICD-10-CM | POA: Diagnosis not present

## 2019-07-08 DIAGNOSIS — M6289 Other specified disorders of muscle: Secondary | ICD-10-CM | POA: Diagnosis not present

## 2019-07-08 DIAGNOSIS — M6281 Muscle weakness (generalized): Secondary | ICD-10-CM | POA: Diagnosis not present

## 2019-07-09 ENCOUNTER — Encounter (HOSPITAL_COMMUNITY): Payer: Self-pay

## 2019-07-09 NOTE — Patient Instructions (Addendum)
DUE TO COVID-19 ONLY ONE VISITOR ARE ALLOWED TO COME WITH YOU AND STAY IN THE WAITING ROOM ONLY DURING PRE OP AND PROCEDURE. THEN TWO VISITORS MAY VISIT WITH YOU IN YOUR PRIVATE ROOM DURING VISITING HOURS ONLY!!   COVID SWAB TESTING MUST BE COMPLETED ON:  Tuesday, Jul 14, 2019  12NOON  [address], [place] -[facility] enter pre surgical testing line (Must self quarantine after testing. Follow instructions on handout.)             Your procedure is scheduled on: Friday, Jul 17, 2019   Report to [facility] Main  Entrance    Report to admitting at 9:00 AM   Call this number if you have problems the morning of surgery [phone]   The day before surgery liquids diet only   CLEAR LIQUID DIET  Foods Allowed                                                                     Foods Excluded  Water, Black Coffee and tea, regular and decaf                             liquids that you cannot  Plain Jell-O in any flavor  (No red)                                           see through such as: Fruit ices (not with fruit pulp)                                     milk, soups, orange juice  Iced Popsicles (No red)                                    All solid food Carbonated beverages, regular and diet                                    Apple juices Sports drinks like Gatorade (No red) Lightly seasoned clear broth or consume(fat free) Sugar, honey syrup  Sample Menu Breakfast                                Lunch                                     Supper Cranberry juice                    Beef broth                            Chicken broth Jell-O  Grape juice                           Apple juice Coffee or tea                        Jell-O                                      Popsicle                                                Coffee or tea                        Coffee or tea   Magnesium Citrate: Drink by noon the day  before surgery.   Do not eat food or drink liquids :After Midnight.  Oral Hygiene is also important to reduce your risk of infection.                                    Remember - BRUSH YOUR TEETH THE MORNING OF SURGERY WITH YOUR REGULAR TOOTHPASTE   Do NOT smoke after Midnight   Take these medicines the morning of surgery with A SIP OF WATER: None                               You may not have any metal on your body including jewelry, and body piercings             Do not wear lotions, powders, perfumes/cologne, or deodorant                           Men may shave face and neck.   Do not bring valuables to the hospital. [place].   Contacts, dentures or bridgework may not be worn into surgery.   Bring small overnight bag day of surgery.    Special Instructions: Bring a copy of your healthcare power of attorney and living will documents         the day of surgery if you haven't scanned them in before.              Please read over the following fact sheets you were given: IF YOU HAVE QUESTIONS ABOUT YOUR PRE OP INSTRUCTIONS PLEASE CALL [phone]   [place] - Preparing for Surgery Before surgery, you can play an important role.  Because skin is not sterile, your skin needs to be as free of germs as possible.  You can reduce the number of germs on your skin by washing with CHG (chlorahexidine gluconate) soap before surgery.  CHG is an antiseptic cleaner which kills germs and bonds with the skin to continue killing germs even after washing. Please DO NOT use if you have an allergy to CHG or antibacterial soaps.  If your skin becomes reddened/irritated stop using the CHG and inform your nurse when you arrive  at Short Stay. Do not shave (including legs and underarms) for at least 48 hours prior to the first CHG shower.  You may shave your face/neck.  Please follow these instructions carefully:  1.  Shower with CHG Soap the night  before surgery and the  morning of surgery.  2.  If you choose to wash your hair, wash your hair first as usual with your normal  shampoo.  3.  After you shampoo, rinse your hair and body thoroughly to remove the shampoo.                             4.  Use CHG as you would any other liquid soap.  You can apply chg directly to the skin and wash.  Gently with a scrungie or clean washcloth.  5.  Apply the CHG Soap to your body ONLY FROM THE NECK DOWN.   Do   not use on face/ open                           Wound or open sores. Avoid contact with eyes, ears mouth and   genitals (private parts).                       Wash face,  Genitals (private parts) with your normal soap.             6.  Wash thoroughly, paying special attention to the area where your    surgery  will be performed.  7.  Thoroughly rinse your body with warm water from the neck down.  8.  DO NOT shower/wash with your normal soap after using and rinsing off the CHG Soap.                9.  Pat yourself dry with a clean towel.            10.  Wear clean pajamas.            11.  Place clean sheets on your bed the night of your first shower and do not  sleep with pets. Day of Surgery : Do not apply any lotions/deodorants the morning of surgery.  Please wear clean clothes to the hospital/surgery center.  FAILURE TO FOLLOW THESE INSTRUCTIONS MAY RESULT IN THE CANCELLATION OF YOUR SURGERY  PATIENT SIGNATURE_________________________________  NURSE SIGNATURE__________________________________  ________________________________________________________________________

## 2019-07-10 ENCOUNTER — Encounter (HOSPITAL_COMMUNITY): Payer: Self-pay

## 2019-07-10 ENCOUNTER — Other Ambulatory Visit: Payer: Self-pay

## 2019-07-10 ENCOUNTER — Encounter (HOSPITAL_COMMUNITY)
Admission: RE | Admit: 2019-07-10 | Discharge: 2019-07-10 | Disposition: A | Discharge status: Home / Self Care | Payer: BC Managed Care – PPO | Source: Ambulatory Visit | Attending: Urology | Admitting: Urology

## 2019-07-10 DIAGNOSIS — Z01812 Encounter for preprocedural laboratory examination: Secondary | ICD-10-CM | POA: Diagnosis not present

## 2019-07-10 HISTORY — DX: Malignant neoplasm of prostate: C61

## 2019-07-10 HISTORY — DX: Hyperlipidemia, unspecified: E78.5

## 2019-07-10 HISTORY — DX: Nontoxic single thyroid nodule: E04.1

## 2019-07-10 HISTORY — DX: Essential (primary) hypertension: I10

## 2019-07-10 HISTORY — DX: Benign prostatic hyperplasia without lower urinary tract symptoms: N40.0

## 2019-07-10 LAB — CBC
HCT: 42.9 % (ref 39.0–52.0)
Hemoglobin: 13.5 g/dL (ref 13.0–17.0)
MCH: 27.7 pg (ref 26.0–34.0)
MCHC: 31.5 g/dL (ref 30.0–36.0)
MCV: 88.1 fL (ref 80.0–100.0)
Platelets: 253 10*3/uL (ref 150–400)
RBC: 4.87 MIL/uL (ref 4.22–5.81)
RDW: 13.8 % (ref 11.5–15.5)
WBC: 4.1 10*3/uL (ref 4.0–10.5)
nRBC: 0 % (ref 0.0–0.2)

## 2019-07-10 NOTE — Progress Notes (Signed)
EKG 11/24/2018 received and placed in chart.

## 2019-07-10 NOTE — Progress Notes (Signed)
PCP - Dr. Audie Pinto Cardiologist - N/A  Chest x-ray - N/A EKG - request from Dr Shelia Media Stress Test - N/A ECHO - N/A Cardiac Cath - N/A  Sleep Study -  N/A CPAP - N/A  Fasting Blood Sugar - N/A Checks Blood Sugar _N/A____ times a day  Blood Thinner Instructions: N/A Aspirin Instructions:N/A Last Dose:N/A  Anesthesia review: N/A  Patient denies shortness of breath, fever, cough and chest pain at PAT appointment   Patient verbalized understanding of instructions that were given to them at the PAT appointment. Patient was also instructed that they will need to review over the PAT instructions again at home before surgery.

## 2019-07-14 ENCOUNTER — Other Ambulatory Visit (HOSPITAL_COMMUNITY)
Admission: RE | Admit: 2019-07-14 | Discharge: 2019-07-14 | Disposition: A | Discharge status: Home / Self Care | Payer: BC Managed Care – PPO | Source: Ambulatory Visit | Attending: Urology | Admitting: Urology

## 2019-07-14 ENCOUNTER — Other Ambulatory Visit (HOSPITAL_COMMUNITY): Payer: BC Managed Care – PPO

## 2019-07-14 DIAGNOSIS — Z20822 Contact with and (suspected) exposure to covid-19: Secondary | ICD-10-CM | POA: Insufficient documentation

## 2019-07-14 DIAGNOSIS — Z01812 Encounter for preprocedural laboratory examination: Secondary | ICD-10-CM | POA: Insufficient documentation

## 2019-07-14 LAB — SARS CORONAVIRUS 2 (TAT 6-24 HRS): SARS Coronavirus 2: NEGATIVE

## 2019-07-16 DIAGNOSIS — N400 Enlarged prostate without lower urinary tract symptoms: Secondary | ICD-10-CM | POA: Diagnosis not present

## 2019-07-16 DIAGNOSIS — C61 Malignant neoplasm of prostate: Secondary | ICD-10-CM | POA: Diagnosis not present

## 2019-07-16 DIAGNOSIS — I10 Essential (primary) hypertension: Secondary | ICD-10-CM | POA: Diagnosis not present

## 2019-07-16 DIAGNOSIS — B159 Hepatitis A without hepatic coma: Secondary | ICD-10-CM | POA: Diagnosis not present

## 2019-07-16 DIAGNOSIS — Z79899 Other long term (current) drug therapy: Secondary | ICD-10-CM | POA: Diagnosis not present

## 2019-07-16 DIAGNOSIS — E785 Hyperlipidemia, unspecified: Secondary | ICD-10-CM | POA: Diagnosis not present

## 2019-07-17 ENCOUNTER — Other Ambulatory Visit: Payer: Self-pay

## 2019-07-17 ENCOUNTER — Encounter (HOSPITAL_COMMUNITY)
Admission: RE | Disposition: A | Discharge status: Home / Self Care | Payer: Self-pay | Source: Home / Self Care | Attending: Urology

## 2019-07-17 ENCOUNTER — Observation Stay (HOSPITAL_COMMUNITY)
Admission: RE | Admit: 2019-07-17 | Discharge: 2019-07-19 | Disposition: A | Discharge status: Home / Self Care | Payer: BC Managed Care – PPO | Attending: Urology | Admitting: Urology

## 2019-07-17 ENCOUNTER — Ambulatory Visit (HOSPITAL_COMMUNITY): Payer: BC Managed Care – PPO | Admitting: Certified Registered Nurse Anesthetist

## 2019-07-17 ENCOUNTER — Encounter (HOSPITAL_COMMUNITY): Payer: Self-pay | Admitting: Urology

## 2019-07-17 DIAGNOSIS — B159 Hepatitis A without hepatic coma: Secondary | ICD-10-CM | POA: Diagnosis not present

## 2019-07-17 DIAGNOSIS — N400 Enlarged prostate without lower urinary tract symptoms: Secondary | ICD-10-CM | POA: Diagnosis not present

## 2019-07-17 DIAGNOSIS — C61 Malignant neoplasm of prostate: Principal | ICD-10-CM | POA: Insufficient documentation

## 2019-07-17 DIAGNOSIS — Z79899 Other long term (current) drug therapy: Secondary | ICD-10-CM | POA: Diagnosis not present

## 2019-07-17 DIAGNOSIS — R972 Elevated prostate specific antigen [PSA]: Secondary | ICD-10-CM | POA: Diagnosis not present

## 2019-07-17 DIAGNOSIS — E785 Hyperlipidemia, unspecified: Secondary | ICD-10-CM | POA: Diagnosis not present

## 2019-07-17 DIAGNOSIS — N390 Urinary tract infection, site not specified: Secondary | ICD-10-CM | POA: Diagnosis not present

## 2019-07-17 DIAGNOSIS — I10 Essential (primary) hypertension: Secondary | ICD-10-CM | POA: Insufficient documentation

## 2019-07-17 HISTORY — PX: LYMPHADENECTOMY: SHX5960

## 2019-07-17 HISTORY — PX: ROBOT ASSISTED LAPAROSCOPIC RADICAL PROSTATECTOMY: SHX5141

## 2019-07-17 LAB — COMPREHENSIVE METABOLIC PANEL
ALT: 44 U/L (ref 0–44)
AST: 31 U/L (ref 15–41)
Albumin: 4.1 g/dL (ref 3.5–5.0)
Alkaline Phosphatase: 50 U/L (ref 38–126)
Anion gap: 8 (ref 5–15)
BUN: 16 mg/dL (ref 8–23)
CO2: 26 mmol/L (ref 22–32)
Calcium: 8.8 mg/dL — ABNORMAL LOW (ref 8.9–10.3)
Chloride: 107 mmol/L (ref 98–111)
Creatinine, Ser: 1.09 mg/dL (ref 0.61–1.24)
GFR calc Af Amer: >60 mL/min (ref >60)
GFR calc non Af Amer: >60 mL/min (ref >60)
Glucose, Bld: 100 mg/dL — ABNORMAL HIGH (ref 70–99)
Potassium: 3.8 mmol/L (ref 3.5–5.1)
Sodium: 141 mmol/L (ref 135–145)
Total Bilirubin: 1.5 mg/dL — ABNORMAL HIGH (ref 0.3–1.2)
Total Protein: 6.9 g/dL (ref 6.5–8.1)

## 2019-07-17 LAB — HEMOGLOBIN AND HEMATOCRIT, BLOOD
HCT: 34.9 % — ABNORMAL LOW (ref 39.0–52.0)
Hemoglobin: 11 g/dL — ABNORMAL LOW (ref 13.0–17.0)

## 2019-07-17 SURGERY — PROSTATECTOMY, RADICAL, ROBOT-ASSISTED, LAPAROSCOPIC
Anesthesia: General

## 2019-07-17 MED ORDER — DEXAMETHASONE SODIUM PHOSPHATE 10 MG/ML IJ SOLN
INTRAMUSCULAR | Status: DC | PRN
  Administered 2019-07-17: 10 mg via INTRAVENOUS

## 2019-07-17 MED ORDER — IRBESARTAN 300 MG PO TABS
300.0000 mg | ORAL_TABLET | Freq: Every day | ORAL | Status: DC
  Administered 2019-07-17 – 2019-07-19 (×3): 300 mg via ORAL
  Filled 2019-07-17 (×3): qty 1

## 2019-07-17 MED ORDER — HYDROCHLOROTHIAZIDE 25 MG PO TABS
12.5000 mg | ORAL_TABLET | Freq: Every day | ORAL | Status: DC
  Administered 2019-07-17 – 2019-07-19 (×3): 12.5 mg via ORAL
  Filled 2019-07-17 (×3): qty 1

## 2019-07-17 MED ORDER — LIDOCAINE 2% (20 MG/ML) 5 ML SYRINGE
INTRAMUSCULAR | Status: DC | PRN
  Administered 2019-07-17: 100 mg via INTRAVENOUS

## 2019-07-17 MED ORDER — HYDROCODONE-ACETAMINOPHEN 5-325 MG PO TABS: 1.0000 | ORAL_TABLET | Freq: Four times a day (QID) | ORAL | 0 refills | Status: AC | PRN

## 2019-07-17 MED ORDER — LACTATED RINGERS IR SOLN
Status: DC | PRN
  Administered 2019-07-17: 1000 mL

## 2019-07-17 MED ORDER — ORAL CARE MOUTH RINSE: 15.0000 mL | Freq: Once | OROMUCOSAL | Status: AC

## 2019-07-17 MED ORDER — OXYCODONE HCL 5 MG PO TABS: 5.0000 mg | ORAL_TABLET | Freq: Once | ORAL | Status: DC | PRN

## 2019-07-17 MED ORDER — ACETAMINOPHEN 500 MG PO TABS
1000.0000 mg | ORAL_TABLET | Freq: Four times a day (QID) | ORAL | Status: AC
  Administered 2019-07-17 – 2019-07-18 (×4): 1000 mg via ORAL
  Filled 2019-07-17 (×4): qty 2

## 2019-07-17 MED ORDER — ONDANSETRON HCL 4 MG/2ML IJ SOLN
INTRAMUSCULAR | Status: DC | PRN
  Administered 2019-07-17: 4 mg via INTRAVENOUS

## 2019-07-17 MED ORDER — FENTANYL CITRATE (PF) 100 MCG/2ML IJ SOLN: 25.0000 ug | INTRAMUSCULAR | Status: DC | PRN

## 2019-07-17 MED ORDER — MAGNESIUM CITRATE PO SOLN
1.0000 | Freq: Once | ORAL | Status: AC
  Administered 2019-07-16: 1 via ORAL
  Filled 2019-07-17: qty 296

## 2019-07-17 MED ORDER — DIPHENHYDRAMINE HCL 12.5 MG/5ML PO ELIX: 12.5000 mg | ORAL_SOLUTION | Freq: Four times a day (QID) | ORAL | Status: DC | PRN

## 2019-07-17 MED ORDER — LIDOCAINE 2% (20 MG/ML) 5 ML SYRINGE
INTRAMUSCULAR | Status: AC
  Filled 2019-07-17: qty 5

## 2019-07-17 MED ORDER — CHLORHEXIDINE GLUCONATE 0.12 % MT SOLN
15.0000 mL | Freq: Once | OROMUCOSAL | Status: AC
  Administered 2019-07-17: 15 mL via OROMUCOSAL

## 2019-07-17 MED ORDER — ACETAMINOPHEN 500 MG PO TABS: 1000.0000 mg | ORAL_TABLET | Freq: Once | ORAL | Status: DC | PRN

## 2019-07-17 MED ORDER — MIDAZOLAM HCL 2 MG/2ML IJ SOLN
INTRAMUSCULAR | Status: AC
  Filled 2019-07-17: qty 2

## 2019-07-17 MED ORDER — HYDROMORPHONE HCL 1 MG/ML IJ SOLN: 0.5000 mg | INTRAMUSCULAR | Status: DC | PRN

## 2019-07-17 MED ORDER — OXYCODONE HCL 5 MG/5ML PO SOLN: 5.0000 mg | Freq: Once | ORAL | Status: DC | PRN

## 2019-07-17 MED ORDER — PHENYLEPHRINE 40 MCG/ML (10ML) SYRINGE FOR IV PUSH (FOR BLOOD PRESSURE SUPPORT): PREFILLED_SYRINGE | INTRAVENOUS | Status: DC | PRN

## 2019-07-17 MED ORDER — OXYCODONE HCL 5 MG PO TABS
5.0000 mg | ORAL_TABLET | ORAL | Status: DC | PRN
  Administered 2019-07-17 – 2019-07-18 (×2): 5 mg via ORAL
  Filled 2019-07-17 (×2): qty 1

## 2019-07-17 MED ORDER — MIDAZOLAM HCL 5 MG/5ML IJ SOLN
INTRAMUSCULAR | Status: DC | PRN
  Administered 2019-07-17: 2 mg via INTRAVENOUS

## 2019-07-17 MED ORDER — BUPIVACAINE LIPOSOME 1.3 % IJ SUSP
20.0000 mL | Freq: Once | INTRAMUSCULAR | Status: AC
  Administered 2019-07-17: 20 mL
  Filled 2019-07-17: qty 20

## 2019-07-17 MED ORDER — SULFAMETHOXAZOLE-TRIMETHOPRIM 800-160 MG PO TABS
1.0000 | ORAL_TABLET | Freq: Two times a day (BID) | ORAL | 0 refills | Status: AC
Start: 2019-07-17 — End: ?

## 2019-07-17 MED ORDER — PHENYLEPHRINE HCL-NACL 10-0.9 MG/250ML-% IV SOLN
INTRAVENOUS | Status: DC | PRN
Start: 2019-07-17 — End: 2019-07-17
  Administered 2019-07-17: 30 ug/min via INTRAVENOUS

## 2019-07-17 MED ORDER — DIPHENHYDRAMINE HCL 50 MG/ML IJ SOLN: 12.5000 mg | Freq: Four times a day (QID) | INTRAMUSCULAR | Status: DC | PRN

## 2019-07-17 MED ORDER — ONDANSETRON HCL 4 MG/2ML IJ SOLN: 4.0000 mg | INTRAMUSCULAR | Status: DC | PRN

## 2019-07-17 MED ORDER — CHLORHEXIDINE GLUCONATE CLOTH 2 % EX PADS
6.0000 | MEDICATED_PAD | Freq: Every day | CUTANEOUS | Status: DC
  Administered 2019-07-17 – 2019-07-19 (×3): 6 via TOPICAL

## 2019-07-17 MED ORDER — AMLODIPINE BESYLATE 5 MG PO TABS
5.0000 mg | ORAL_TABLET | Freq: Every evening | ORAL | Status: DC
  Administered 2019-07-17 – 2019-07-18 (×2): 5 mg via ORAL
  Filled 2019-07-17 (×2): qty 1

## 2019-07-17 MED ORDER — ROSUVASTATIN CALCIUM 10 MG PO TABS
10.0000 mg | ORAL_TABLET | Freq: Every evening | ORAL | Status: DC
  Administered 2019-07-17 – 2019-07-18 (×2): 10 mg via ORAL
  Filled 2019-07-17 (×2): qty 1

## 2019-07-17 MED ORDER — ROCURONIUM BROMIDE 10 MG/ML (PF) SYRINGE
PREFILLED_SYRINGE | INTRAVENOUS | Status: DC | PRN
  Administered 2019-07-17: 40 mg via INTRAVENOUS
  Administered 2019-07-17: 60 mg via INTRAVENOUS

## 2019-07-17 MED ORDER — DOCUSATE SODIUM 100 MG PO CAPS
100.0000 mg | ORAL_CAPSULE | Freq: Two times a day (BID) | ORAL | Status: DC
  Administered 2019-07-17 – 2019-07-19 (×4): 100 mg via ORAL
  Filled 2019-07-17 (×4): qty 1

## 2019-07-17 MED ORDER — SODIUM CHLORIDE 0.9 % IV BOLUS
1000.0000 mL | Freq: Once | INTRAVENOUS | Status: AC
  Administered 2019-07-17: 1000 mL via INTRAVENOUS

## 2019-07-17 MED ORDER — ROCURONIUM BROMIDE 10 MG/ML (PF) SYRINGE
PREFILLED_SYRINGE | INTRAVENOUS | Status: AC
  Filled 2019-07-17: qty 10

## 2019-07-17 MED ORDER — FENTANYL CITRATE (PF) 250 MCG/5ML IJ SOLN
INTRAMUSCULAR | Status: AC
  Filled 2019-07-17: qty 5

## 2019-07-17 MED ORDER — CEFAZOLIN SODIUM-DEXTROSE 2-4 GM/100ML-% IV SOLN
2.0000 g | INTRAVENOUS | Status: AC
  Administered 2019-07-17: 2 g via INTRAVENOUS
  Filled 2019-07-17: qty 100

## 2019-07-17 MED ORDER — ACETAMINOPHEN 160 MG/5ML PO SOLN: 1000.0000 mg | Freq: Once | ORAL | Status: DC | PRN

## 2019-07-17 MED ORDER — FENTANYL CITRATE (PF) 250 MCG/5ML IJ SOLN
INTRAMUSCULAR | Status: DC | PRN
  Administered 2019-07-17 (×3): 50 ug via INTRAVENOUS
  Administered 2019-07-17: 100 ug via INTRAVENOUS
  Administered 2019-07-17 (×2): 50 ug via INTRAVENOUS

## 2019-07-17 MED ORDER — BACITRACIN-NEOMYCIN-POLYMYXIN 400-5-5000 EX OINT
1.0000 "application " | TOPICAL_OINTMENT | Freq: Three times a day (TID) | CUTANEOUS | Status: DC | PRN
  Administered 2019-07-19: 1 via TOPICAL

## 2019-07-17 MED ORDER — STERILE WATER FOR IRRIGATION IR SOLN
Status: DC | PRN
  Administered 2019-07-17: 1000 mL

## 2019-07-17 MED ORDER — FENTANYL CITRATE (PF) 100 MCG/2ML IJ SOLN
INTRAMUSCULAR | Status: AC
  Filled 2019-07-17: qty 2

## 2019-07-17 MED ORDER — SODIUM CHLORIDE (PF) 0.9 % IJ SOLN
INTRAMUSCULAR | Status: DC | PRN
  Administered 2019-07-17: 20 mL

## 2019-07-17 MED ORDER — BELLADONNA ALKALOIDS-OPIUM 16.2-60 MG RE SUPP: 1.0000 | Freq: Four times a day (QID) | RECTAL | Status: DC | PRN

## 2019-07-17 MED ORDER — SUGAMMADEX SODIUM 200 MG/2ML IV SOLN
INTRAVENOUS | Status: DC | PRN
  Administered 2019-07-17: 200 mg via INTRAVENOUS

## 2019-07-17 MED ORDER — ONDANSETRON HCL 4 MG/2ML IJ SOLN
INTRAMUSCULAR | Status: AC
  Filled 2019-07-17: qty 2

## 2019-07-17 MED ORDER — PROPOFOL 10 MG/ML IV BOLUS
INTRAVENOUS | Status: DC | PRN
  Administered 2019-07-17: 160 mg via INTRAVENOUS

## 2019-07-17 MED ORDER — DEXAMETHASONE SODIUM PHOSPHATE 10 MG/ML IJ SOLN
INTRAMUSCULAR | Status: AC
  Filled 2019-07-17: qty 1

## 2019-07-17 MED ORDER — SODIUM CHLORIDE (PF) 0.9 % IJ SOLN
INTRAMUSCULAR | Status: AC
  Filled 2019-07-17: qty 20

## 2019-07-17 MED ORDER — LACTATED RINGERS IV SOLN: INTRAVENOUS | Status: DC

## 2019-07-17 MED ORDER — DEXTROSE-NACL 5-0.45 % IV SOLN: INTRAVENOUS | Status: DC

## 2019-07-17 MED ORDER — PROPOFOL 10 MG/ML IV BOLUS
INTRAVENOUS | Status: AC
  Filled 2019-07-17: qty 20

## 2019-07-17 MED ORDER — ACETAMINOPHEN 10 MG/ML IV SOLN: 1000.0000 mg | Freq: Once | INTRAVENOUS | Status: DC | PRN

## 2019-07-17 SURGICAL SUPPLY — 66 items
APPLICATOR COTTON TIP 6 STRL (MISCELLANEOUS) ×2 IMPLANT
APPLICATOR COTTON TIP 6IN STRL (MISCELLANEOUS) ×4
CATH FOLEY 2WAY SLVR 18FR 30CC (CATHETERS) ×4 IMPLANT
CATH TIEMANN FOLEY 18FR 5CC (CATHETERS) ×4 IMPLANT
CHLORAPREP W/TINT 26 (MISCELLANEOUS) ×4 IMPLANT
CLIP VESOLOCK LG 6/CT PURPLE (CLIP) ×8 IMPLANT
CNTNR URN SCR LID CUP LEK RST (MISCELLANEOUS) ×2 IMPLANT
CONT SPEC 4OZ STRL OR WHT (MISCELLANEOUS) ×3
COVER SURGICAL LIGHT HANDLE (MISCELLANEOUS) ×4 IMPLANT
COVER TIP SHEARS 8 DVNC (MISCELLANEOUS) ×2 IMPLANT
COVER TIP SHEARS 8MM DA VINCI (MISCELLANEOUS) ×2
COVER WAND RF STERILE (DRAPES) IMPLANT
CUTTER ECHEON FLEX ENDO 45 340 (ENDOMECHANICALS) ×4 IMPLANT
DECANTER SPIKE VIAL GLASS SM (MISCELLANEOUS) ×4 IMPLANT
DERMABOND ADVANCED (GAUZE/BANDAGES/DRESSINGS) ×2
DERMABOND ADVANCED .7 DNX12 (GAUZE/BANDAGES/DRESSINGS) ×2 IMPLANT
DRAIN CHANNEL RND F F (WOUND CARE) IMPLANT
DRAPE ARM DVNC X/XI (DISPOSABLE) ×8 IMPLANT
DRAPE COLUMN DVNC XI (DISPOSABLE) ×2 IMPLANT
DRAPE DA VINCI XI ARM (DISPOSABLE) ×12
DRAPE DA VINCI XI COLUMN (DISPOSABLE) ×4
DRAPE SURG IRRIG POUCH 19X23 (DRAPES) ×4 IMPLANT
DRSG TEGADERM 4X4.75 (GAUZE/BANDAGES/DRESSINGS) ×4 IMPLANT
ELECT REM PT RETURN 15FT ADLT (MISCELLANEOUS) ×4 IMPLANT
GAUZE 4X4 16PLY RFD (DISPOSABLE) IMPLANT
GAUZE SPONGE 2X2 8PLY STRL LF (GAUZE/BANDAGES/DRESSINGS) IMPLANT
GLOVE BIO SURGEON STRL SZ 6.5 (GLOVE) ×3 IMPLANT
GLOVE BIO SURGEONS STRL SZ 6.5 (GLOVE) ×1
GLOVE BIOGEL M STRL SZ7.5 (GLOVE) ×8 IMPLANT
GLOVE BIOGEL PI IND STRL 7.5 (GLOVE) ×2 IMPLANT
GLOVE BIOGEL PI INDICATOR 7.5 (GLOVE) ×2
GOWN STRL REUS W/TWL LRG LVL3 (GOWN DISPOSABLE) ×12 IMPLANT
HOLDER FOLEY CATH W/STRAP (MISCELLANEOUS) ×4 IMPLANT
IRRIG SUCT STRYKERFLOW 2 WTIP (MISCELLANEOUS) ×4
IRRIGATION SUCT STRKRFLW 2 WTP (MISCELLANEOUS) ×2 IMPLANT
IV LACTATED RINGERS 1000ML (IV SOLUTION) ×4 IMPLANT
KIT PROCEDURE DA VINCI SI (MISCELLANEOUS) ×4
KIT PROCEDURE DVNC SI (MISCELLANEOUS) ×2 IMPLANT
KIT TURNOVER KIT A (KITS) IMPLANT
NEEDLE INSUFFLATION 14GA 120MM (NEEDLE) ×4 IMPLANT
NEEDLE SPNL 22GX7 QUINCKE BK (NEEDLE) ×4 IMPLANT
PACK ROBOTIC CUSTOM UROLOGY (CUSTOM PROCEDURE TRAY) ×4 IMPLANT
PAD POSITIONING PINK XL (MISCELLANEOUS) ×4 IMPLANT
PENCIL SMOKE EVACUATOR (MISCELLANEOUS) IMPLANT
PORT ACCESS TROCAR AIRSEAL 12 (TROCAR) ×2 IMPLANT
PORT ACCESS TROCAR AIRSEAL 5M (TROCAR) ×2
SEAL CANN UNIV 5-8 DVNC XI (MISCELLANEOUS) ×8 IMPLANT
SEAL XI 5MM-8MM UNIVERSAL (MISCELLANEOUS) ×8
SET TRI-LUMEN FLTR TB AIRSEAL (TUBING) ×4 IMPLANT
SOLUTION ELECTROLUBE (MISCELLANEOUS) ×4 IMPLANT
SPONGE GAUZE 2X2 STER 10/PKG (GAUZE/BANDAGES/DRESSINGS)
SPONGE LAP 4X18 RFD (DISPOSABLE) ×4 IMPLANT
STAPLE RELOAD 45 GRN (STAPLE) ×2 IMPLANT
STAPLE RELOAD 45MM GREEN (STAPLE) ×3
SUT ETHILON 3 0 PS 1 (SUTURE) ×4 IMPLANT
SUT MNCRL AB 4-0 PS2 18 (SUTURE) ×8 IMPLANT
SUT PDS AB 1 CT1 27 (SUTURE) ×8 IMPLANT
SUT VIC AB 2-0 SH 27 (SUTURE) ×4
SUT VIC AB 2-0 SH 27X BRD (SUTURE) ×2 IMPLANT
SUT VICRYL 0 UR6 27IN ABS (SUTURE) ×4 IMPLANT
SUT VLOC BARB 180 ABS3/0GR12 (SUTURE) ×12
SUTURE VLOC BRB 180 ABS3/0GR12 (SUTURE) ×6 IMPLANT
SYR 27GX1/2 1ML LL SAFETY (SYRINGE) ×4 IMPLANT
TOWEL OR NON WOVEN STRL DISP B (DISPOSABLE) ×4 IMPLANT
TROCAR XCEL NON-BLD 5MMX100MML (ENDOMECHANICALS) IMPLANT
WATER STERILE IRR 1000ML POUR (IV SOLUTION) ×4 IMPLANT

## 2019-07-17 NOTE — Plan of Care (Signed)
  Problem: Education: Goal: Knowledge of the procedure and recovery process will improve Outcome: Progressing   Problem: Bowel/Gastric: Goal: Gastrointestinal status for postoperative course will improve Outcome: Progressing   Problem: Pain Management: Goal: General experience of comfort will improve Outcome: Progressing   Problem: Skin Integrity: Goal: Demonstration of wound healing without infection will improve Outcome: Progressing   Problem: Urinary Elimination: Goal: Ability to avoid or minimize complications of infection will improve Outcome: Progressing   Problem: Education: Goal: Knowledge of General Education information will improve Description: Including pain rating scale, medication(s)/side effects and non-pharmacologic comfort measures Outcome: Progressing   Problem: Clinical Measurements: Goal: Will remain free from infection Outcome: Progressing   Problem: Activity: Goal: Risk for activity intolerance will decrease Outcome: Progressing

## 2019-07-17 NOTE — Brief Op Note (Signed)
07/17/2019  2:33 PM  PATIENT:  Tony Kim  64 y.o. male  PRE-OPERATIVE DIAGNOSIS:  PROSTATE CANCER  POST-OPERATIVE DIAGNOSIS:  PROSTATE CANCER  PROCEDURE:  Procedure(s) with comments: XI ROBOTIC ASSISTED LAPAROSCOPIC RADICAL PROSTATECTOMY (N/A) - 3 HRS LYMPHADENECTOMY (Bilateral)  SURGEON:  Surgeon(s) and Role:    * Alexis Frock, MD - Primary  PHYSICIAN ASSISTANT:   ASSISTANTS: Clemetine Marker PA   ANESTHESIA:   local and general  EBL:  250 mL   BLOOD ADMINISTERED:none  DRAINS: 1 - JP to bulb; 2 - Foley to gravity   LOCAL MEDICATIONS USED:  MARCAINE     SPECIMEN:  Source of Specimen:  1 - prostatectomy; 2 - pelvic lymph nodes  DISPOSITION OF SPECIMEN:  PATHOLOGY  COUNTS:  YES  TOURNIQUET:  * No tourniquets in log *  DICTATION: .Other Dictation: Dictation Number [id number]   PLAN OF CARE: Admit for overnight observation  PATIENT DISPOSITION:  PACU - hemodynamically stable.   Delay start of Pharmacological VTE agent (>24hrs) due to surgical blood loss or risk of bleeding: yes

## 2019-07-17 NOTE — Discharge Instructions (Signed)

## 2019-07-17 NOTE — H&P (Signed)
Tony Kim is an 64 y.o. male.    Chief Complaint: Pre-OP Prostatectomy  HPI:   1 - Moderate Risk Prostate Cancer - 4 cores up to 55% grade 4 cancer by fusion BX 2021 on eval PSA 7.5. TRUS 59mL, no median. [facility] predicts 9% chance node involvment.   PMH sig for detached retina (spontaneous), No CV disease / blood thinners. He works from home in [employer]. His wife Anice Paganini is very involved. His PCP is Deland Pretty MD.   Today " Tony Kim " is seen to proceed with prostatecomy. NO interval fevers. C19 screen negative.   Past Medical History:  Diagnosis Date  . Allergic rhinitis   . BPH (benign prostatic hyperplasia)    Mild  . GERD (gastroesophageal reflux disease)   . Hepatitis B   . Hyperlipidemia   . Hypertension   . Prostate cancer ([place])   . Thyroid nodule     Past Surgical History:  Procedure Laterality Date  . COLONOSCOPY    . EYE SURGERY Right    retinal detachment repair  . PROSTATE BIOPSY  2017    Family History  Problem Relation Age of Onset  . Hyperlipidemia Mother   . Hyperlipidemia Father    Social History:  reports that he has never smoked. He has never used smokeless tobacco. He reports previous alcohol use of about 2.0 standard drinks of alcohol per week. He reports that he does not use drugs.  Allergies: No Known Allergies  No medications prior to admission.    No results found for this or any previous visit (from the past 48 hour(s)). No results found.  Review of Systems  Constitutional: Negative for chills and fever.  All other systems reviewed and are negative.   There were no vitals taken for this visit. Physical Exam  Constitutional: He appears well-developed.  HENT:  Head: Normocephalic.  Eyes: Pupils are equal, round, and reactive to light.  Cardiovascular: Normal rate.  Respiratory: Effort normal.  GI: Soft.  Genitourinary:    Genitourinary Comments: No CVAT   Musculoskeletal:        General: Normal range of motion.    Cervical back: Normal range of motion.  Neurological: He is alert.  Skin: Skin is warm.  Psychiatric: He has a normal mood and affect.     Assessment/Plan  Proceed as planned with prostatectomy / node dissection. Risks, benefits, alternatives, expected peri-op course discussed previously and reiterated today.   Alexis Frock, MD 07/17/2019, 6:49 AM

## 2019-07-17 NOTE — Anesthesia Procedure Notes (Signed)
Procedure Name: Intubation Date/Time: 07/17/2019 11:24 AM Performed by: Mitzie Na, CRNA Pre-anesthesia Checklist: Patient identified, Emergency Drugs available, Suction available and Patient being monitored Patient Re-evaluated:Patient Re-evaluated prior to induction Oxygen Delivery Method: Circle system utilized Preoxygenation: Pre-oxygenation with 100% oxygen Induction Type: IV induction Ventilation: Mask ventilation without difficulty and Oral airway inserted - appropriate to patient size Laryngoscope Size: Mac and 4 Grade View: Grade II Tube type: Oral Tube size: 7.5 mm Number of attempts: 1 Airway Equipment and Method: Stylet and Oral airway Placement Confirmation: ETT inserted through vocal cords under direct vision,  positive ETCO2 and breath sounds checked- equal and bilateral Secured at: 24 cm Tube secured with: Tape Dental Injury: Teeth and Oropharynx as per pre-operative assessment

## 2019-07-17 NOTE — Transfer of Care (Signed)
Immediate Anesthesia Transfer of Care Note  Patient: Tony Kim  Procedure(s) Performed: XI ROBOTIC ASSISTED LAPAROSCOPIC RADICAL PROSTATECTOMY (N/A ) LYMPHADENECTOMY (Bilateral )  Patient Location: PACU  Anesthesia Type:General  Level of Consciousness: awake, alert , oriented and patient cooperative  Airway & Oxygen Therapy: Patient Spontanous Breathing and Patient connected to face mask oxygen  Post-op Assessment: Report given to RN, Post -op Vital signs reviewed and stable and Patient moving all extremities  Post vital signs: Reviewed and stable  Last Vitals:  Vitals Value Taken Time  BP 162/92 07/17/19 1445  Temp    Pulse 88 07/17/19 1446  Resp    SpO2 100 % 07/17/19 1446  Vitals shown include unvalidated device data.  Last Pain:  Vitals:   07/17/19 0923  TempSrc:   PainSc: 0-No pain         Complications: No apparent anesthesia complications

## 2019-07-17 NOTE — Anesthesia Postprocedure Evaluation (Signed)
Anesthesia Post Note  Patient: Tony Kim  Procedure(s) Performed: XI ROBOTIC ASSISTED LAPAROSCOPIC RADICAL PROSTATECTOMY (N/A ) LYMPHADENECTOMY (Bilateral )     Patient location during evaluation: PACU Anesthesia Type: General Level of consciousness: awake and alert Pain management: pain level controlled Vital Signs Assessment: post-procedure vital signs reviewed and stable Respiratory status: spontaneous breathing, nonlabored ventilation, respiratory function stable and patient connected to nasal cannula oxygen Cardiovascular status: blood pressure returned to baseline and stable Postop Assessment: no apparent nausea or vomiting Anesthetic complications: no    Last Vitals:  Vitals:   07/17/19 1842 07/17/19 1955  BP: (!) 168/97 (!) 148/99  Pulse:  96  Resp:  14  Temp:  37.1 C  SpO2:  98%    Last Pain:  Vitals:   07/17/19 2000  TempSrc:   PainSc: 8                  CHRISTOPHER MOSER

## 2019-07-17 NOTE — Progress Notes (Signed)
JP site leaking around dressing. Dressing reinforced. MD has been made aware. No new orders placed.

## 2019-07-17 NOTE — Op Note (Signed)
NAME: Tony Kim, Tony Kim MEDICAL RECORD [id number] ACCOUNT [id number] DATE OF BIRTH:October 29, 1955 FACILITY: [facility] LOCATION: [id number] PHYSICIAN:Neziah Braley, MD  OPERATIVE REPORT  DATE OF PROCEDURE:  07/17/2019  PREOPERATIVE DIAGNOSIS:  Moderate risk prostate cancer.  NAME OF PROCEDURES: 1.  Robotic-assisted laparoscopic radical prostatectomy. 2.  Bilateral pelvic lymph node dissection.  ESTIMATED BLOOD LOSS:  250 mL.  COMPLICATIONS:  None.  SPECIMEN: 1.  Right external iliac lymph nodes. 2.  Right obturator lymph nodes. 3.  Right perivesical lymph node. 4.  Left external iliac lymph nodes. 5.  Left obturator lymph nodes. 6.  Left perivesical lymph node. 7.  Radical prostatectomy for permanent pathology.  SURGEON:  Alexis Frock, MD  ASSISTANT:  Debbrah Alar, PA  DRAINS:   1.  Jackson-Pratt drain to bulb suction. 2.  Foley catheter to straight drain.  FINDINGS: 1.  Desmoplastic adhesions in the posterior aspect of the prostate without any obvious locally advanced disease. 2.  Small volume bilateral sentinel pelvic lymph nodes denoted on pathology requisition.  INDICATIONS:  The patient is a very pleasant 64 year old [place] man who was found on workup of elevated PSA by MRI fusion biopsy to have multifocal moderate risk adenocarcinoma of the prostate.  Options were discussed for management, including  surveillance protocols versus ablative therapy versus surgical extirpation and he wished to have radical prostatectomy with curative intent.  Informed consent was obtained and placed in the medical record.  DESCRIPTION OF PROCEDURE:  The patient being identified, the procedure being radical prostatectomy was confirmed.  Procedure timeout was performed.  Intravenous antibiotics administered.  General endotracheal anesthesia induced.  The patient was placed  into a low lithotomy position.  A sterile field was created, prepping and draping the patient's penis, perineum and  proximal thighs using iodine and his infra-xiphoid abdomen using chlorhexidine gluconate after he was further fastened to the operative  table using 3-inch tape over foam padding across the supraxiphoid chest.  His arms were tucked to his side.  A test of steep Trendelenburg positioning was performed and he found to be suitably positioned.  A Foley catheter was placed per urethra to  straight drain.  Next, a high-flow, low-pressure pneumoperitoneum was obtained using Veress technique in the supraumbilical midline, having passed the aspiration and drop test.  An 8 mm robotic camera was then placed in the same location.  Laparoscopic  examination peritoneal cavity revealed no significant adhesions, no visceral injury.  Additional  ports were placed as follows:  Right paramedian 8 mm robotic port, right far lateral 12 mm AirSeal assistant port, right paramedian 5 mm suction port, left  paramedian 8 mm robotic port, left far lateral 8 mm robotic port.  The robot was docked and passed the electronic checks.  Initial attention was directed at development of the space of Retzius.  Incision was made lateral to the right medial umbilical  ligament from the midline towards the area of the internal ring, coursing along the iliac vessels towards the area of the right ureter.  The right vas deferens was encountered, purposely ligated and used as a medial bucket handle while the right bladder  wall was separated from the pelvic sidewall towards the area of the endopelvic fascia.  A mirror image dissection was performed on the left side and anterior attachments were taken down using cautery scissors.  This exposed the anterior base of the  prostate, which was defatted to better denote the bladder neck-prostate junction with the fatty tissues aside, labeled as periprosthetic fat.  Next, 0.2  mL of green dye was injected into each lobe of the prostate using a percutaneously placed robotically  guided spinal needle with  intervening dye suctioning to prevent spillage.  Next, the endopelvic fascia was carefully swept away from the lateral aspect of the prostate in a base to apex orientation.  This exposed the dorsal venous complex which was  controlled using a green load stapler, resulting in excellent hemostatic control of the dorsal venous complex. When it had been approximately 10 minutes post-dye injection, the pelvis was inspected under near infrared fluorescence light.  Sentinel lymphangiography revealed multiple lymphatic channels coursing towards the lymphatic fields on each side, most prominently with a small perivesical sentinel lymph node.  As such, a standard template lymphadenectomy was performed, first on the  right side of the right external iliac group with the boundaries being right external iliac artery, vein, pelvic sidewall, iliac bifurcation.  Lymphostasis was achieved with cold clips, set aside, labeled right external lymph nodes.  Next, the right  obturator group was dissected free with the boundaries being right external iliac vein, pelvic sidewall, obturator nerve.  Lymphostasis was achieved with cold clips, set aside, labeled right obturator lymph nodes.  The right obturator nerve was inspected  following maneuvers and found to be uninjured.  A mirror image lymphadenectomy was performed on the left side, with boundaries being left external iliac, left obturator group respectively.  Left obturator nerve was also inspected following these  maneuvers and found to be uninjured and perivesical lymph nodes were sampled from each side and set aside and labeled as such.  Attention was directed at bladder neck dissection.  A lateral release was performed on each side of the prostate, allowing the  bladder neck caliber to be better demarcated.  Then the bladder neck was separated from the prostate in an anterior, posterior direction, keeping what appeared to be a rim of circular muscle fibers in each plane of  dissection.  Posterior dissection was  performed by incising approximately 7 mm inferoposterior to the posterior lip of the prostate, entering the plane Denonvilliers.  This area was somewhat adhesed, but without any obvious locally advanced disease, likely consistent with a reaction of the  prior biopsy.  Bilateral vas deferens were encountered, dissected for a distance of 4 cm, ligated and placed on gentle superior traction.  Bilateral seminal vesicles were dissected to their tip and placed on gentle superior traction.  Dissection  proceeded inferiorly towards the area of the apex of the prostate.  This exposed the vascular pedicles on each side.  The left pedicle was somewhat wide and controlled using a sequential clipping technique in a mediolateral orientation, which revealed  excellent hemostatic control of the left pedicle.  The right pedicle was controlled similarly, but using a single row clipping technique.  Final apical dissection was performed in the anterior plane, placing the prostate on superior traction, transecting  the membranous urethra coldly.  This completely freed up the prostatectomy.  Specimen was placed in EndoCatch bag for later retrieval.  Digital rectal exam was then performed with indicator glove.  No evidence of rectal violation was noted with  laparoscopic vision.  Posterior dissection was then performed using a 3-0 V-Loc suture, reapproximating the posterior urethral plate, the posterior bladder neck, bringing these structures into tension-free apposition and finally mucosa-to-mucosa  anastomosis was performed using double arm 3-0 V-Loc suture from the 6 o'clock to 12 o'clock position, which resulted in excellent tension-free anastomosis of the urethra to the bladder neck.  A new Foley catheter was placed per urethra to straight  drain, which irrigated quantitatively.  At this point, sponge and needle counts were correct.  Hemostasis appeared acceptable.  We achieved the goals  of surgery today.  A closed suction drain was brought up to the previous left lateral most robotic port  site into the area of the peritoneal cavity.  The previous right lateral 12 mm assistant port site was closed with fascia using Carter-Thomason suture passer and 0 Vicryl.  The specimen was retrieved by extending the previous camera port site superiorly  and inferiorly for a total distance of approximately 4 cm, removing the prostatectomy specimen, setting it aside for permanent pathology.  The extraction site was closed at the level of the fascia using figure-of-eight PDS x4, followed by reapproximation  of Scarpa's with running Vicryl.  All incision sites were infiltrated with dilute lipolyzed Marcaine and closed at the level of the skin using subcuticular Monocryl, followed by Dermabond.  Procedure was then terminated.  The patient tolerated the  procedure well.  No immediate perioperative complications.  The patient was taken to postanesthesia care unit in stable condition with plan for observation admission.    Please note, first assistant Debbrah Alar was crucial for all portions of the surgery today.  She provided invaluable retraction, suctioning, vascular stapling, lymphatic clipping and general first assistance.  VN/NUANCE  D:07/17/2019 T:07/17/2019 JOB:[id number]

## 2019-07-17 NOTE — Anesthesia Preprocedure Evaluation (Addendum)
Anesthesia Evaluation  Patient identified by MRN, date of birth, ID band Patient awake    Reviewed: Allergy & Precautions, NPO status , Patient's Chart, lab work & pertinent test results  Airway Mallampati: II  TM Distance: >3 FB Neck ROM: Full    Dental  (+) Dental Advisory Given, Teeth Intact   Pulmonary neg pulmonary ROS, neg recent URI,    breath sounds clear to auscultation       Cardiovascular hypertension, Pt. on medications (-) angina(-) Past MI and (-) CHF  Rhythm:Regular     Neuro/Psych negative neurological ROS  negative psych ROS   GI/Hepatic GERD  Controlled,(+) Hepatitis -, A, BElevated bili 1.5, other labs nl   Endo/Other  negative endocrine ROS  Renal/GU negative Renal ROS     Musculoskeletal negative musculoskeletal ROS (+)   Abdominal   Peds  Hematology negative hematology ROS (+)   Anesthesia Other Findings   Reproductive/Obstetrics                            Anesthesia Physical Anesthesia Plan  ASA: II  Anesthesia Plan: General   Post-op Pain Management:    Induction: Intravenous  PONV Risk Score and Plan: 2 and Ondansetron and Dexamethasone  Airway Management Planned: Oral ETT  Additional Equipment: None  Intra-op Plan:   Post-operative Plan: Extubation in OR  Informed Consent: I have reviewed the patients History and Physical, chart, labs and discussed the procedure including the risks, benefits and alternatives for the proposed anesthesia with the patient or authorized representative who has indicated his/her understanding and acceptance.     Dental advisory given  Plan Discussed with: CRNA and Surgeon  Anesthesia Plan Comments:         Anesthesia Quick Evaluation

## 2019-07-18 DIAGNOSIS — B159 Hepatitis A without hepatic coma: Secondary | ICD-10-CM | POA: Diagnosis not present

## 2019-07-18 DIAGNOSIS — E785 Hyperlipidemia, unspecified: Secondary | ICD-10-CM | POA: Diagnosis not present

## 2019-07-18 DIAGNOSIS — Z79899 Other long term (current) drug therapy: Secondary | ICD-10-CM | POA: Diagnosis not present

## 2019-07-18 DIAGNOSIS — N400 Enlarged prostate without lower urinary tract symptoms: Secondary | ICD-10-CM | POA: Diagnosis not present

## 2019-07-18 DIAGNOSIS — C61 Malignant neoplasm of prostate: Secondary | ICD-10-CM | POA: Diagnosis not present

## 2019-07-18 DIAGNOSIS — I10 Essential (primary) hypertension: Secondary | ICD-10-CM | POA: Diagnosis not present

## 2019-07-18 LAB — BASIC METABOLIC PANEL
Anion gap: 3 — ABNORMAL LOW (ref 5–15)
BUN: 16 mg/dL (ref 8–23)
CO2: 27 mmol/L (ref 22–32)
Calcium: 8 mg/dL — ABNORMAL LOW (ref 8.9–10.3)
Chloride: 105 mmol/L (ref 98–111)
Creatinine, Ser: 1.19 mg/dL (ref 0.61–1.24)
GFR calc Af Amer: >60 mL/min (ref >60)
GFR calc non Af Amer: >60 mL/min (ref >60)
Glucose, Bld: 172 mg/dL — ABNORMAL HIGH (ref 70–99)
Potassium: 3.9 mmol/L (ref 3.5–5.1)
Sodium: 135 mmol/L (ref 135–145)

## 2019-07-18 LAB — HEMOGLOBIN AND HEMATOCRIT, BLOOD
HCT: 31 % — ABNORMAL LOW (ref 39.0–52.0)
Hemoglobin: 10 g/dL — ABNORMAL LOW (ref 13.0–17.0)

## 2019-07-18 MED ORDER — BISACODYL 10 MG RE SUPP
10.0000 mg | Freq: Once | RECTAL | Status: AC
  Administered 2019-07-18: 10 mg via RECTAL
  Filled 2019-07-18: qty 1

## 2019-07-18 NOTE — Progress Notes (Addendum)
Urology Progress Note   1 Day Post-Op from robotic prostatectomy, ICG, pelvic lymphadenectomy.   Subjective: PACU Hgb 11.0 NAEON. Moderate discomfort throughout abdomen and back Feels bloated. No flatus.  Has walked once. No nausea, belching, or emesis. Tolerating CLD. UOP not documented overnight, but suspect adequate/marginal based on 44ml in bag this AM and slightly concentrated appearing.   Objective: Vital signs in last 24 hours: Temp:  [97.9 F (36.6 C)-99.5 F (37.5 C)] 98.1 F (36.7 C) (05/15 0746) Pulse Rate:  [71-99] 71 (05/15 0746) Resp:  [12-20] 16 (05/15 0746) BP: (130-168)/(69-99) 130/81 (05/15 0746) SpO2:  [96 %-100 %] 100 % (05/15 0746)  Intake/Output from previous day: 05/14 0701 - 05/15 0700 In: 3181.1 [P.O.:360; I.V.:2671.1; IV Piggyback:100] Out: 520 [Urine:125; Drains:145; Blood:250] Intake/Output this shift: Total I/O In: -  Out: 400 [Urine:400]  Physical Exam:  General: Alert and oriented CV: Regular rate Lungs: No increased work of breathing Abdomen: Soft, appropriately tender, moderately distended. Incisions c/d/i. JP SS GU: Foley in place draining clear yellow urine  Ext: NT, No erythema  Lab Results: Recent Labs    07/17/19 1521 07/18/19 0541  HGB 11.0* 10.0*  HCT 34.9* 31.0*   Recent Labs    07/17/19 0935 07/18/19 0541  NA 141 135  K 3.8 3.9  CL 107 105  CO2 26 27  GLUCOSE 100* 172*  BUN 16 16  CREATININE 1.09 1.19  CALCIUM 8.8* 8.0*    Studies/Results: No results found.  Assessment/Plan:  64 y.o. male s/p robotic assisted laparoscopic prostatectomy, ICG, bilateral pelvic lymphadenectomy 07/17/2019  Moderate Risk Prostate Cancer - 4 cores up to 55% grade 4 cancer by fusion BX 2021 on eval PSA 7.5. TRUS 75mL, no median. [facility] predicts 9% chance node involvment. Now s/p robotic assisted laparoscopic prostatectomy, ICG, bilateral pelvic lymphadenectomy 07/17/2019.   - Multimodal analgesia - Continue CLD and mIVF given  abdominal distension and AROBF - Strict outputs; monitor JP - If JP output remains clinical low, will remove today.  - OOB, walking, IS, SCDs - Bowel regimen and suppository - Pathology pending  Hypertension Continue home amlodipine, irbesartan, and HCTZ  Hyperlipidemia Continue Statin  Dispo: Pending RoBF, improvement in abdominal distension, pain control, PO intake. Anticipate later today if marked improvements vs tomorrow.    LOS: 0 days

## 2019-07-18 NOTE — Progress Notes (Signed)
Patient ambulated in the hall multiple times today. Patient reports he is still unable to pass flatulence.

## 2019-07-18 NOTE — Discharge Summary (Signed)
[facility] Discharge Summary  Admit date: 07/17/2019  Discharge date and time: 07/19/19   Discharge to: Home  Discharge Service: Urology  Discharge Attending Physician:  Dr. Raynelle Bring, MD  Discharge  Diagnoses: Prostate cancer  Secondary Diagnosis: Active Problems:   Prostate cancer [facility])   OR Procedures: Procedure(s): XI ROBOTIC ASSISTED LAPAROSCOPIC RADICAL PROSTATECTOMY LYMPHADENECTOMY 07/17/2019   Ancillary Procedures: None   Discharge Day Services: The patient was seen and examined by the Urology team both in the morning and immediately prior to discharge. JP drain creatinine was consistent with serum and was therefore removed. Vital signs and laboratory values were stable and within normal limits.  The physical exam was benign and unchanged and all surgical wounds were examined.  Discharge instructions were explained and all questions answered.  Subjective  No acute events overnight. Pain Controlled. No fever or chills.  Objective Patient Vitals for the past 8 hrs:  BP Temp Temp src Pulse Resp SpO2  07/19/19 0932 121/76 -- -- -- -- --  07/19/19 0532 128/77 99.3 F (37.4 C) Oral 64 18 98 %   Total I/O In: -  Out: 30 [Drains:30]  General Appearance:        No acute distress Lungs:                       Normal work of breathing on room air Heart:                                Regular rate and rhythm Abdomen:                         Soft, approrpiately-tender, mildly-distended Extremities:                      Warm and well perfused GU:        Foley draining clear yellow urine.   Hospital Course:  The patient underwent robotic prostatectomy with instillation of ICG and bilateral pelvic lymphadenectomy on 07/17/2019.  The patient tolerated the procedure well, was extubated in the OR, and afterwards was taken to the PACU for routine post-surgical care. When stable the patient was transferred to the floor. The patient did well postoperatively. On the morning  of POD1, he was slightly distended and had not passed flatus. He did well with hydration, had a bowel movement and walked several times with interval improvement in abdominal distention and discomfort. JP drain creatinine was consistent with serum and was therefore removed. The patient's diet was advanced and at the time of discharge was tolerating a diet without nausea. The patient was discharged home 2 Day Post-Op, at which point was tolerating a regular solid diet, was able to care for his catheter, have adequate pain control with P.O. pain medication, and could ambulate without difficulty. The patient will follow up with [place] for post op check and trial of void.   Condition at Discharge: Improved  Discharge Medications:  Allergies as of 07/19/2019   No Known Allergies     Medication List    TAKE these medications   amLODipine 5 MG tablet Commonly known as: NORVASC Take 5 mg by mouth every evening.   hydrochlorothiazide 25 MG tablet Commonly known as: HYDRODIURIL Take 12.5 mg by mouth daily.   HYDROcodone-acetaminophen 5-325 MG tablet Commonly known as: Norco Take 1-2 tablets by mouth every 6 (six) hours as  needed for moderate pain.   olmesartan 40 MG tablet Commonly known as: BENICAR Take 40 mg by mouth daily.   rosuvastatin 10 MG tablet Commonly known as: CRESTOR Take 10 mg by mouth every evening.   sulfamethoxazole-trimethoprim 800-160 MG tablet Commonly known as: BACTRIM DS Take 1 tablet by mouth 2 (two) times daily. Start the day prior to foley removal appointment

## 2019-07-18 NOTE — Plan of Care (Signed)
  Problem: Education: Goal: Knowledge of the procedure and recovery process will improve Outcome: Progressing   Problem: Bowel/Gastric: Goal: Gastrointestinal status for postoperative course will improve Outcome: Progressing   Problem: Pain Management: Goal: General experience of comfort will improve Outcome: Progressing   Problem: Skin Integrity: Goal: Demonstration of wound healing without infection will improve Outcome: Progressing   Problem: Urinary Elimination: Goal: Ability to avoid or minimize complications of infection will improve Outcome: Progressing Goal: Ability to achieve and maintain urine output will improve Outcome: Progressing Goal: Home care management will improve Outcome: Progressing   Problem: Education: Goal: Knowledge of General Education information will improve Description: Including pain rating scale, medication(s)/side effects and non-pharmacologic comfort measures Outcome: Progressing   Problem: Clinical Measurements: Goal: Will remain free from infection Outcome: Progressing   Problem: Activity: Goal: Risk for activity intolerance will decrease Outcome: Progressing

## 2019-07-19 DIAGNOSIS — C61 Malignant neoplasm of prostate: Secondary | ICD-10-CM | POA: Diagnosis not present

## 2019-07-19 DIAGNOSIS — Z79899 Other long term (current) drug therapy: Secondary | ICD-10-CM | POA: Diagnosis not present

## 2019-07-19 DIAGNOSIS — B159 Hepatitis A without hepatic coma: Secondary | ICD-10-CM | POA: Diagnosis not present

## 2019-07-19 DIAGNOSIS — I10 Essential (primary) hypertension: Secondary | ICD-10-CM | POA: Diagnosis not present

## 2019-07-19 DIAGNOSIS — N400 Enlarged prostate without lower urinary tract symptoms: Secondary | ICD-10-CM | POA: Diagnosis not present

## 2019-07-19 DIAGNOSIS — E785 Hyperlipidemia, unspecified: Secondary | ICD-10-CM | POA: Diagnosis not present

## 2019-07-19 LAB — HEMOGLOBIN AND HEMATOCRIT, BLOOD
HCT: 27.9 % — ABNORMAL LOW (ref 39.0–52.0)
Hemoglobin: 8.9 g/dL — ABNORMAL LOW (ref 13.0–17.0)

## 2019-07-19 LAB — CREATININE, FLUID (PLEURAL, PERITONEAL, JP DRAINAGE): Creat, Fluid: 1.1 mg/dL

## 2019-07-19 MED ORDER — LIDOCAINE 5 % EX PTCH
1.0000 | MEDICATED_PATCH | CUTANEOUS | Status: DC
  Filled 2019-07-19: qty 1

## 2019-07-19 MED ORDER — ACETAMINOPHEN 500 MG PO TABS
1000.0000 mg | ORAL_TABLET | Freq: Four times a day (QID) | ORAL | Status: DC
  Administered 2019-07-19: 1000 mg via ORAL
  Filled 2019-07-19: qty 2

## 2019-07-19 NOTE — Progress Notes (Signed)
JP drain removed without complication. IV removed. Discharge instructions reviewed with patient and patients wife. All questions answered.

## 2019-07-22 LAB — SURGICAL PATHOLOGY

## 2019-08-06 DIAGNOSIS — R1031 Right lower quadrant pain: Secondary | ICD-10-CM | POA: Diagnosis not present

## 2019-08-06 DIAGNOSIS — R8271 Bacteriuria: Secondary | ICD-10-CM | POA: Diagnosis not present

## 2019-08-14 DIAGNOSIS — R102 Pelvic and perineal pain: Secondary | ICD-10-CM | POA: Diagnosis not present

## 2019-08-14 DIAGNOSIS — M6281 Muscle weakness (generalized): Secondary | ICD-10-CM | POA: Diagnosis not present

## 2019-08-14 DIAGNOSIS — M62838 Other muscle spasm: Secondary | ICD-10-CM | POA: Diagnosis not present

## 2019-08-14 DIAGNOSIS — N393 Stress incontinence (female) (male): Secondary | ICD-10-CM | POA: Diagnosis not present

## 2019-08-21 DIAGNOSIS — M62838 Other muscle spasm: Secondary | ICD-10-CM | POA: Diagnosis not present

## 2019-08-21 DIAGNOSIS — N393 Stress incontinence (female) (male): Secondary | ICD-10-CM | POA: Diagnosis not present

## 2019-08-21 DIAGNOSIS — R102 Pelvic and perineal pain: Secondary | ICD-10-CM | POA: Diagnosis not present

## 2019-08-21 DIAGNOSIS — M6281 Muscle weakness (generalized): Secondary | ICD-10-CM | POA: Diagnosis not present

## 2019-10-23 DIAGNOSIS — C61 Malignant neoplasm of prostate: Secondary | ICD-10-CM | POA: Diagnosis not present

## 2019-10-27 DIAGNOSIS — N5231 Erectile dysfunction following radical prostatectomy: Secondary | ICD-10-CM | POA: Diagnosis not present

## 2019-10-27 DIAGNOSIS — C61 Malignant neoplasm of prostate: Secondary | ICD-10-CM | POA: Diagnosis not present

## 2019-10-27 DIAGNOSIS — N393 Stress incontinence (female) (male): Secondary | ICD-10-CM | POA: Diagnosis not present

## 2019-11-24 DIAGNOSIS — Z125 Encounter for screening for malignant neoplasm of prostate: Secondary | ICD-10-CM | POA: Diagnosis not present

## 2019-11-24 DIAGNOSIS — E7800 Pure hypercholesterolemia, unspecified: Secondary | ICD-10-CM | POA: Diagnosis not present

## 2019-11-24 DIAGNOSIS — Z0000 Encounter for general adult medical examination without abnormal findings: Secondary | ICD-10-CM | POA: Diagnosis not present

## 2019-11-25 DIAGNOSIS — H35371 Puckering of macula, right eye: Secondary | ICD-10-CM | POA: Diagnosis not present

## 2019-11-25 DIAGNOSIS — H26491 Other secondary cataract, right eye: Secondary | ICD-10-CM | POA: Diagnosis not present

## 2019-11-25 DIAGNOSIS — H2512 Age-related nuclear cataract, left eye: Secondary | ICD-10-CM | POA: Diagnosis not present

## 2019-11-25 DIAGNOSIS — Z961 Presence of intraocular lens: Secondary | ICD-10-CM | POA: Diagnosis not present

## 2019-11-26 DIAGNOSIS — E875 Hyperkalemia: Secondary | ICD-10-CM | POA: Diagnosis not present

## 2019-12-01 DIAGNOSIS — Z8546 Personal history of malignant neoplasm of prostate: Secondary | ICD-10-CM | POA: Diagnosis not present

## 2019-12-01 DIAGNOSIS — N5231 Erectile dysfunction following radical prostatectomy: Secondary | ICD-10-CM | POA: Diagnosis not present

## 2019-12-01 DIAGNOSIS — N393 Stress incontinence (female) (male): Secondary | ICD-10-CM | POA: Diagnosis not present

## 2019-12-01 DIAGNOSIS — Z23 Encounter for immunization: Secondary | ICD-10-CM | POA: Diagnosis not present

## 2019-12-01 DIAGNOSIS — D72819 Decreased white blood cell count, unspecified: Secondary | ICD-10-CM | POA: Diagnosis not present

## 2020-02-09 DIAGNOSIS — C61 Malignant neoplasm of prostate: Secondary | ICD-10-CM | POA: Diagnosis not present

## 2020-02-15 DIAGNOSIS — N393 Stress incontinence (female) (male): Secondary | ICD-10-CM | POA: Diagnosis not present

## 2020-02-15 DIAGNOSIS — C61 Malignant neoplasm of prostate: Secondary | ICD-10-CM | POA: Diagnosis not present

## 2020-02-15 DIAGNOSIS — N5231 Erectile dysfunction following radical prostatectomy: Secondary | ICD-10-CM | POA: Diagnosis not present

## 2020-03-02 DIAGNOSIS — Z23 Encounter for immunization: Secondary | ICD-10-CM | POA: Diagnosis not present

## 2020-05-27 IMAGING — US US ABDOMEN LIMITED
1 series · 14 of 25 positions shown · non-contrast
Comparison: 12/09/2017

CLINICAL DATA: Hepatitis-B

EXAM:
ULTRASOUND ABDOMEN LIMITED RIGHT UPPER QUADRANT

[Series 1: us abdomen limited · 0.15mm/px · 14 of 48 slices shown]
[im 1/48]
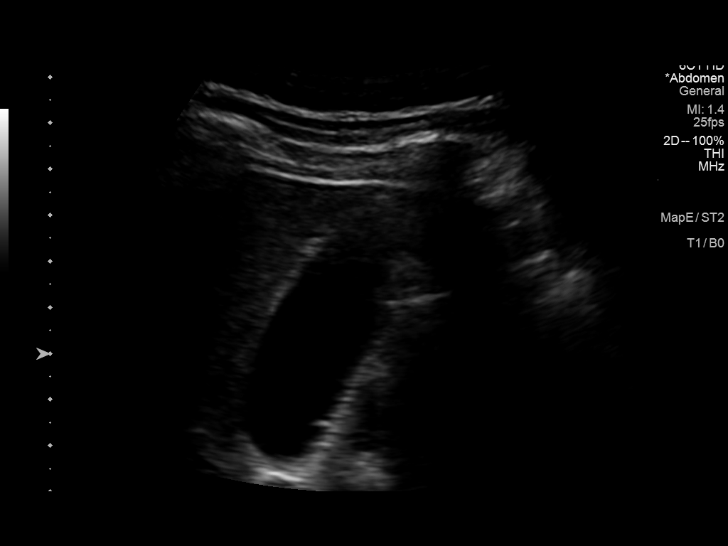
[im 4/48]
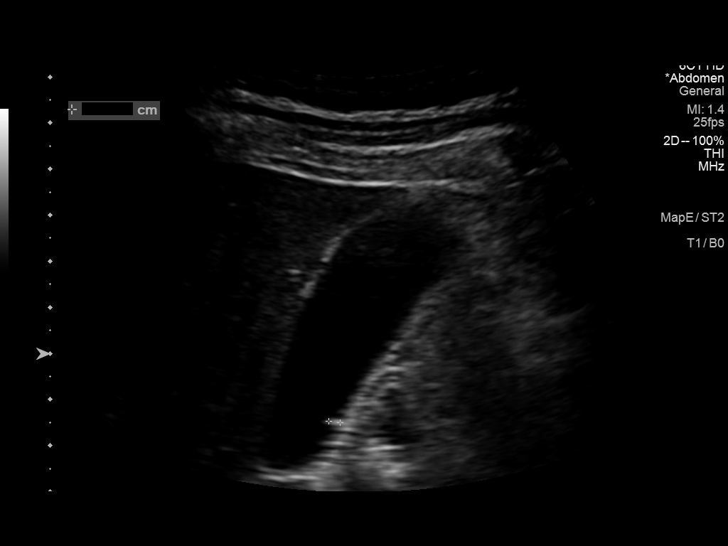
[im 8/48]
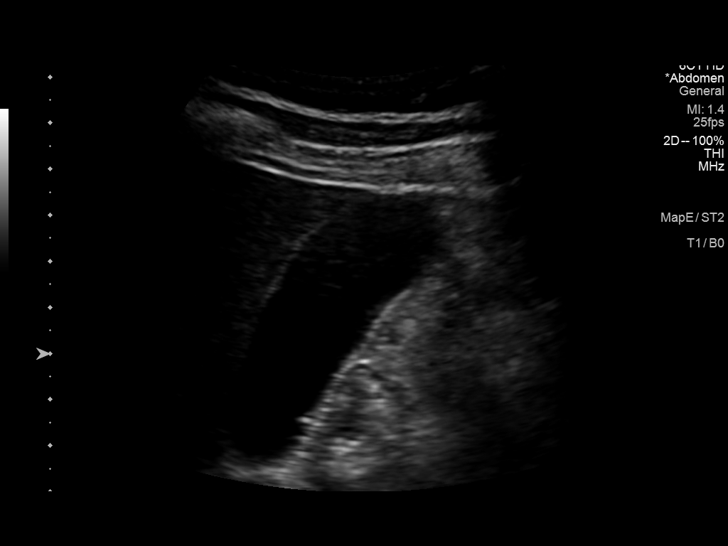
[im 12/48]
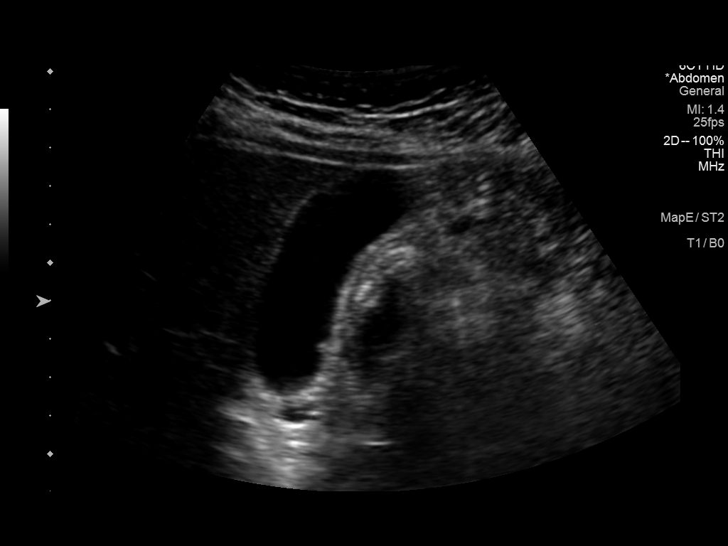
[im 16/48]
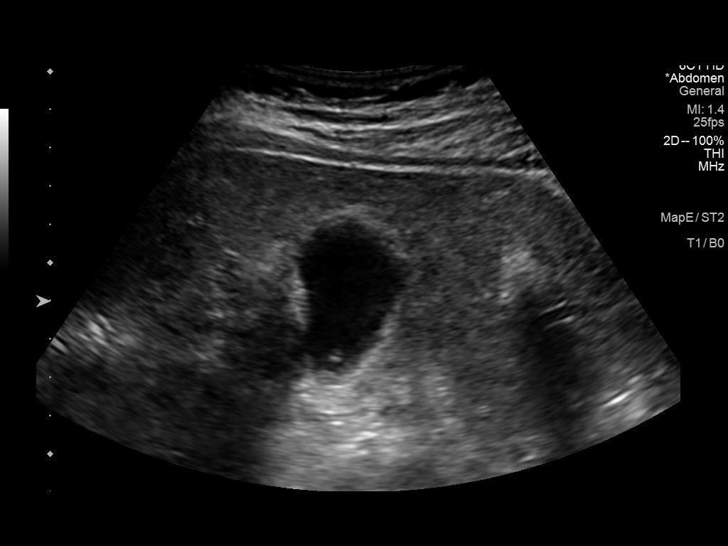
[im 18/48]
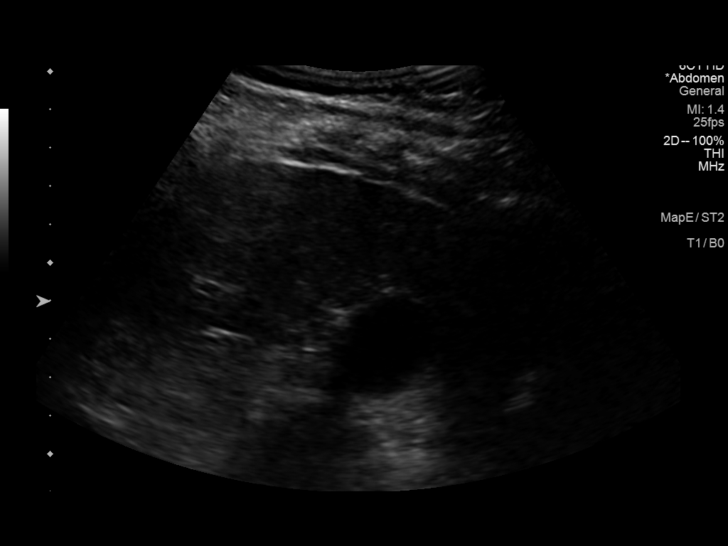
[im 22/48]
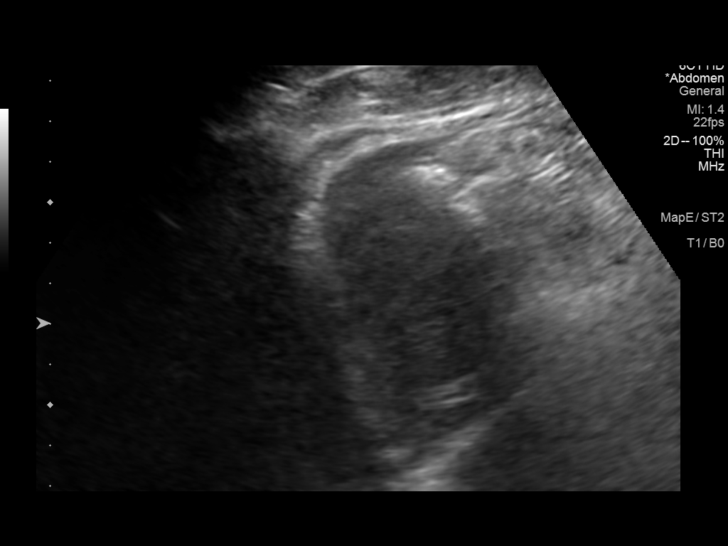
[im 26/48]
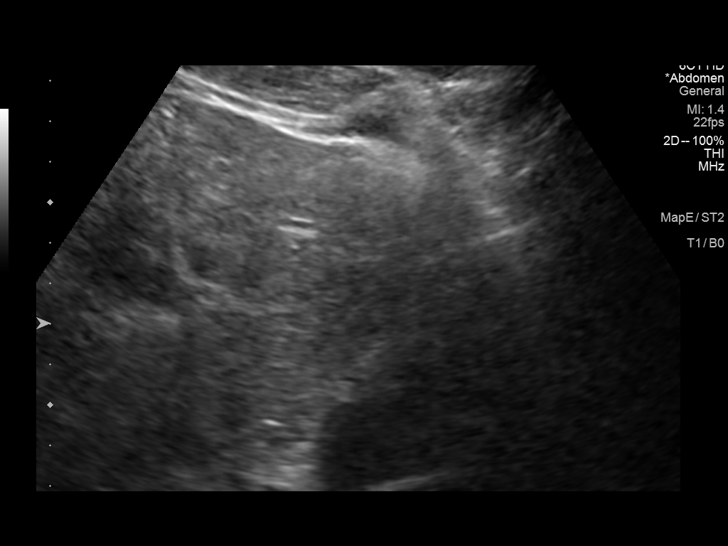
[im 30/48]
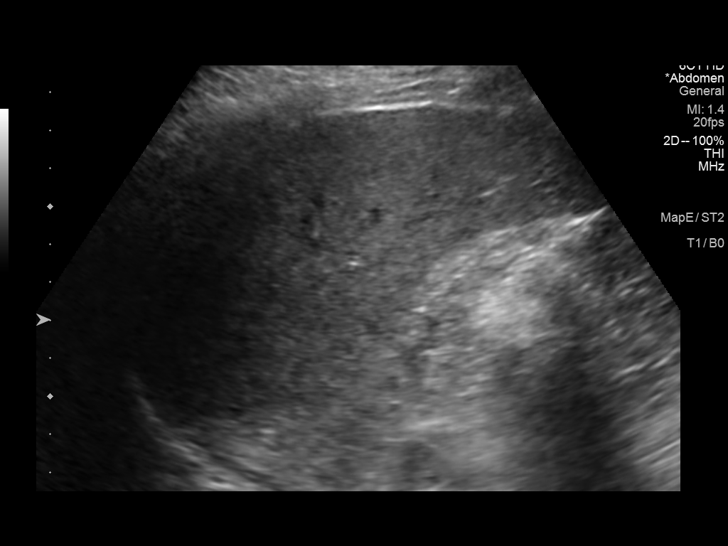
[im 32/48]
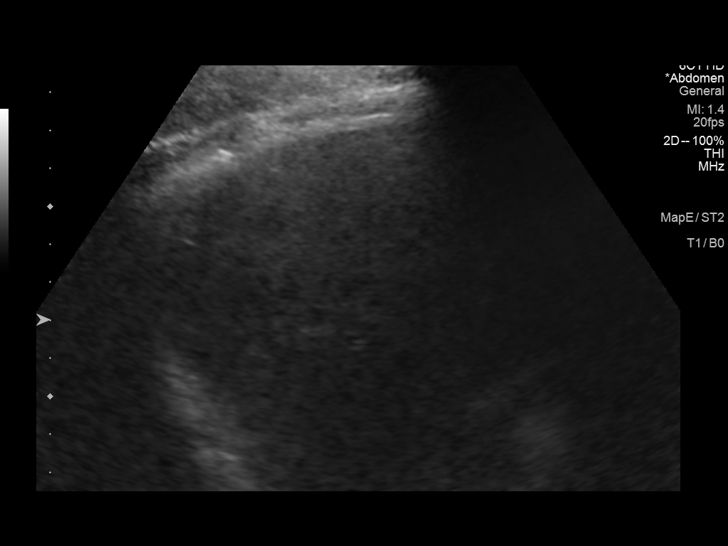
[im 36/48]
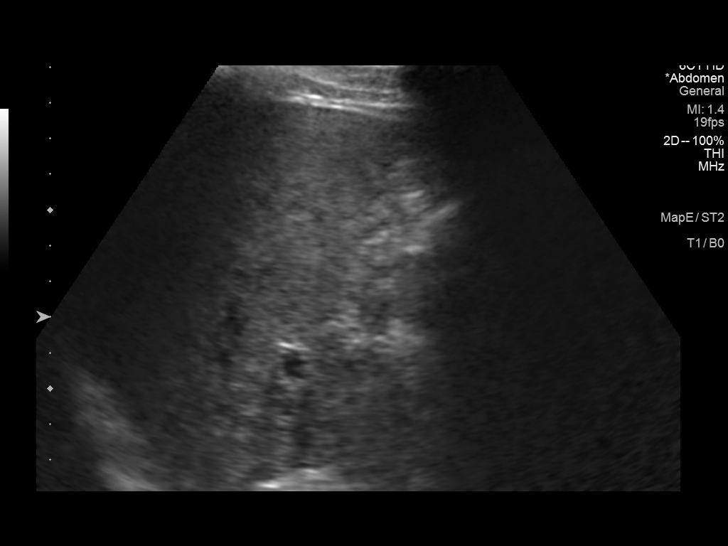
[im 40/48]
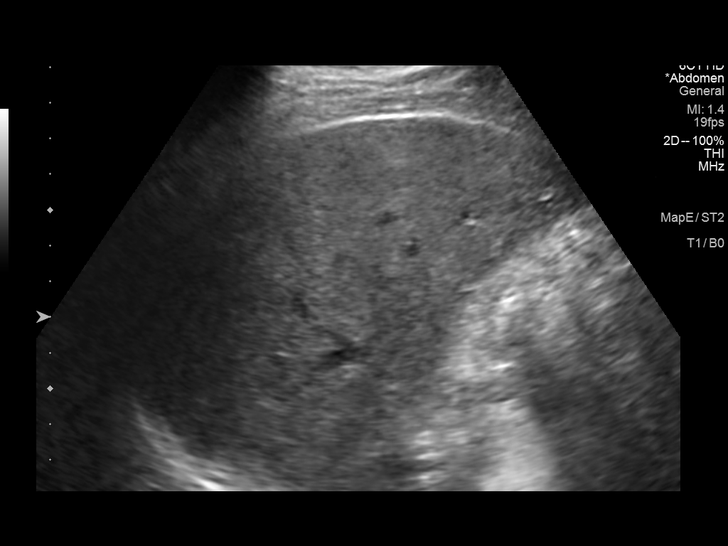
[im 44/48]
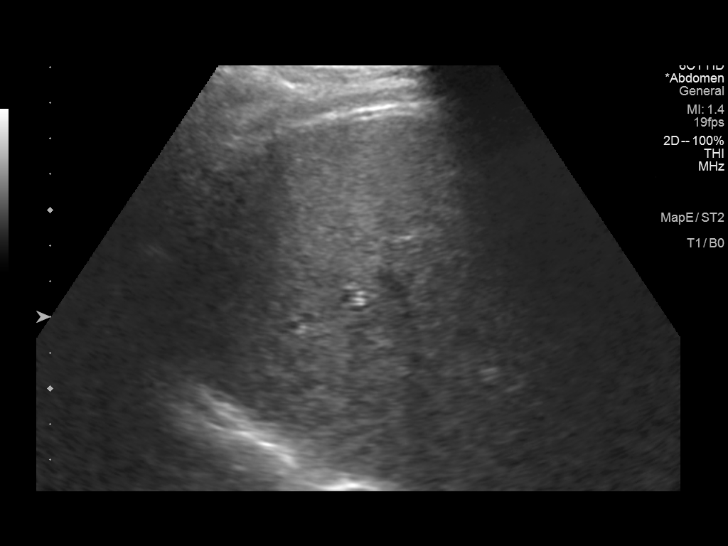
[im 48/48]
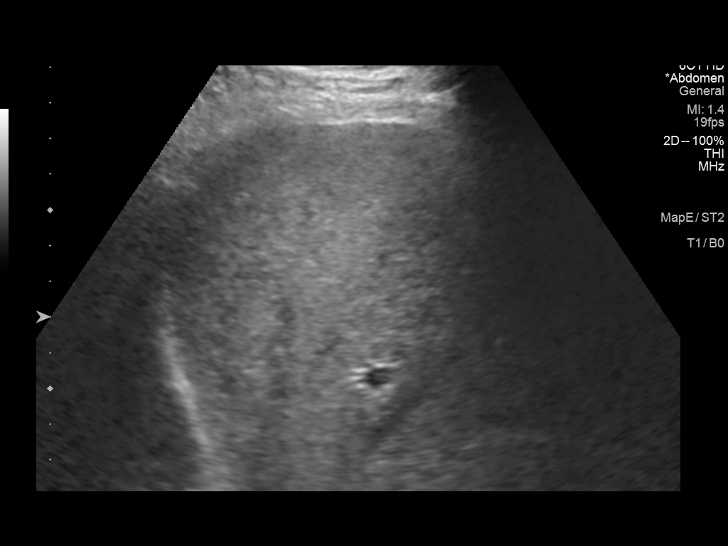

[14 of 25 positions shown; findings below may reference images not displayed]

FINDINGS: Gallbladder:

Small echogenic 3 mm focus along the gallbladder wall, non mobile
and nonshadowing compatible with small gallbladder wall polyp. This
is unchanged since prior study. No visible stones, wall thickening
or pericholecystic fluid.

Common bile duct:

Diameter: Normal caliber, 4 mm

Liver:

Heterogeneous, increased echotexture throughout the liver compatible
with fatty infiltration or intrinsic liver disease. No focal hepatic
abnormality or biliary ductal dilatation. Portal vein is patent on
color Doppler imaging with normal direction of blood flow towards
the liver.

Other: None.
IMPRESSION: Heterogeneous, slightly increased echotexture throughout the liver
suggesting fatty infiltration or intrinsic liver disease. No focal
hepatic abnormality.

Stable small gallbladder wall polyp.

No acute findings.

## 2020-08-08 DIAGNOSIS — C61 Malignant neoplasm of prostate: Secondary | ICD-10-CM | POA: Diagnosis not present

## 2020-08-15 DIAGNOSIS — N393 Stress incontinence (female) (male): Secondary | ICD-10-CM | POA: Diagnosis not present

## 2020-08-15 DIAGNOSIS — N5231 Erectile dysfunction following radical prostatectomy: Secondary | ICD-10-CM | POA: Diagnosis not present

## 2020-08-15 DIAGNOSIS — C61 Malignant neoplasm of prostate: Secondary | ICD-10-CM | POA: Diagnosis not present

## 2020-11-29 DIAGNOSIS — Z125 Encounter for screening for malignant neoplasm of prostate: Secondary | ICD-10-CM | POA: Diagnosis not present

## 2020-11-29 DIAGNOSIS — Z0000 Encounter for general adult medical examination without abnormal findings: Secondary | ICD-10-CM | POA: Diagnosis not present

## 2020-12-07 DIAGNOSIS — D72819 Decreased white blood cell count, unspecified: Secondary | ICD-10-CM | POA: Diagnosis not present

## 2020-12-07 DIAGNOSIS — Z0000 Encounter for general adult medical examination without abnormal findings: Secondary | ICD-10-CM | POA: Diagnosis not present

## 2020-12-07 DIAGNOSIS — Z23 Encounter for immunization: Secondary | ICD-10-CM | POA: Diagnosis not present

## 2020-12-07 DIAGNOSIS — I10 Essential (primary) hypertension: Secondary | ICD-10-CM | POA: Diagnosis not present

## 2020-12-07 DIAGNOSIS — E7800 Pure hypercholesterolemia, unspecified: Secondary | ICD-10-CM | POA: Diagnosis not present

## 2020-12-07 DIAGNOSIS — J309 Allergic rhinitis, unspecified: Secondary | ICD-10-CM | POA: Diagnosis not present

## 2020-12-08 ENCOUNTER — Other Ambulatory Visit: Payer: Self-pay | Admitting: Internal Medicine

## 2020-12-08 DIAGNOSIS — I10 Essential (primary) hypertension: Secondary | ICD-10-CM

## 2020-12-26 ENCOUNTER — Ambulatory Visit
Admission: RE | Admit: 2020-12-26 | Discharge: 2020-12-26 | Disposition: A | Discharge status: Home / Self Care | Payer: No Typology Code available for payment source | Source: Ambulatory Visit | Attending: Internal Medicine | Admitting: Internal Medicine

## 2020-12-26 DIAGNOSIS — I10 Essential (primary) hypertension: Secondary | ICD-10-CM

## 2021-02-06 DIAGNOSIS — D72819 Decreased white blood cell count, unspecified: Secondary | ICD-10-CM | POA: Diagnosis not present

## 2021-02-13 DIAGNOSIS — C61 Malignant neoplasm of prostate: Secondary | ICD-10-CM | POA: Diagnosis not present

## 2021-02-20 DIAGNOSIS — N393 Stress incontinence (female) (male): Secondary | ICD-10-CM | POA: Diagnosis not present

## 2021-02-20 DIAGNOSIS — N5231 Erectile dysfunction following radical prostatectomy: Secondary | ICD-10-CM | POA: Diagnosis not present

## 2021-02-20 DIAGNOSIS — I2584 Coronary atherosclerosis due to calcified coronary lesion: Secondary | ICD-10-CM | POA: Diagnosis not present

## 2021-02-20 DIAGNOSIS — C61 Malignant neoplasm of prostate: Secondary | ICD-10-CM | POA: Diagnosis not present

## 2021-02-20 DIAGNOSIS — J8410 Pulmonary fibrosis, unspecified: Secondary | ICD-10-CM | POA: Diagnosis not present

## 2021-02-20 DIAGNOSIS — I2510 Atherosclerotic heart disease of native coronary artery without angina pectoris: Secondary | ICD-10-CM | POA: Diagnosis not present

## 2021-12-13 ENCOUNTER — Other Ambulatory Visit: Payer: Self-pay | Admitting: Internal Medicine

## 2021-12-13 DIAGNOSIS — B181 Chronic viral hepatitis B without delta-agent: Secondary | ICD-10-CM

## 2021-12-19 ENCOUNTER — Encounter: Payer: Self-pay | Admitting: Internal Medicine

## 2021-12-19 ENCOUNTER — Ambulatory Visit (INDEPENDENT_AMBULATORY_CARE_PROVIDER_SITE_OTHER): Payer: Managed Care, Other (non HMO) | Admitting: Internal Medicine

## 2021-12-19 ENCOUNTER — Other Ambulatory Visit: Payer: Self-pay

## 2021-12-19 VITALS — BP 174/100 | HR 69 | Temp 98.0°F | Ht 66.0 in | Wt 192.0 lb

## 2021-12-19 DIAGNOSIS — B180 Chronic viral hepatitis B with delta-agent: Secondary | ICD-10-CM

## 2021-12-19 NOTE — Progress Notes (Signed)
[place] for Infectious Disease  Reason for Consult: Chronic hepatitis B Referring Physician: Dr. Deland Pretty  Assessment: He has chronic active hepatitis B. He previously see dr Megan Salon. Mention of eAg positive but I can't see on epic  No clear family hx but he thinks his mother died from hepatitis  He was told he has hep b since 80s   I reviewed his most recent labs from 2018: Hep a ab reactive Hep c ab nonreactive Hep d ab nonreactive Hep B sAg positive Fibrosis score 0.41  12/2017 hep b dna 88 million; lft  He has a liver biopsy in 2009 but I can't find the record of it on epic  His most recent lft 2023 is rising but unclear if due to meds, weight gain/diabetes. He doesn't drink alcohol    Plan:  Will repeat full hepatitis panel today, cbc, and cmp, and inr/afp Will also check his liver elastography ultrasound Discuss with him even with "silent" immune tolerant disease we do miss occult liver inflammation. His age is a little concerning for me and I have lower threshold to treat He doesn't know about family hx liver disease F/u 1 month after testing   I have spent a total of 30 minutes of face-to-face and non-face-to-face time, excluding clinical staff time, preparing to see patient, ordering tests and/or medications, and provide counseling the patient   Patient Active Problem List   Diagnosis Date Noted   Prostate cancer ([place]) 07/17/2019   Leukopenia 12/24/2017   Elevated PSA 12/05/2017   Hypercholesterolemia 12/05/2017   Chronic viral hepatitis B without delta-agent ([place]) 11/20/2016   UTI (urinary tract infection) 08/05/2013    Patient's Medications  New Prescriptions   No medications on file  Previous Medications   AMLODIPINE (NORVASC) 5 MG TABLET    Take 5 mg by mouth every evening.    ASPIRIN EC 81 MG TABLET    1 tablet Orally Once a day for 360 day(s)   HYDROCHLOROTHIAZIDE (HYDRODIURIL) 25 MG TABLET    Take 12.5 mg by mouth  daily.   HYDROCODONE-ACETAMINOPHEN (NORCO) 5-325 MG TABLET    Take 1-2 tablets by mouth every 6 (six) hours as needed for moderate pain.   OLMESARTAN (BENICAR) 40 MG TABLET    Take 40 mg by mouth daily.   ROSUVASTATIN (CRESTOR) 10 MG TABLET    Take 10 mg by mouth every evening.    SULFAMETHOXAZOLE-TRIMETHOPRIM (BACTRIM DS) 800-160 MG TABLET    Take 1 tablet by mouth 2 (two) times daily. Start the day prior to foley removal appointment  Modified Medications   No medications on file  Discontinued Medications   No medications on file    HPI: Tony Kim is a 65 y.o. male who is referred to me for evaluation and management of chronic hepatitis B. He states that he first learned of his hepatitis B Infection in the 1980s. He believes he was infected by his mother who died this past 08/28/2022. She had hepatitis B infection. He denies other risk fctors. He says that he has never had any complications of his infection that he is aware of. He has never been on treatment. He says that his wife has been tested and is hepatitis B negative.He is not sure if she has ever been vaccinated. He works in Pharmacist, hospital for 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  ------ 12/19/21 clinica assessment He is referred  here again to f/u on hep b, by his pcp He has mild lft elevation 2-3 times unl (which is 30 for male) the past month He is at baseline health as he last saw dr Megan Salon in 2018  No fatigue, no new polyarthralgia No fluid in belly No rash No kidney issue No LE edema No heart failure  I asked him about his family history for liver disease; he thinks his mom died from hepatitis but he is not sure    Review of Systems: All other ros negative    Past Medical History:  Diagnosis Date   Allergic rhinitis    BPH (benign prostatic hyperplasia)    Mild   GERD (gastroesophageal reflux disease)    Hepatitis B    Hyperlipidemia    Hypertension     Prostate cancer ([place])    Thyroid nodule     Social History   Tobacco Use   Smoking status: Never   Smokeless tobacco: Never  Vaping Use   Vaping Use: Never used  Substance Use Topics   Alcohol use: Not Currently    Alcohol/week: 2.0 standard drinks of alcohol    Types: 2 Standard drinks or equivalent per week   Drug use: No    Family History  Problem Relation Age of Onset   Hyperlipidemia Mother    Hyperlipidemia Father    No Known Allergies  OBJECTIVE: Vitals:   12/19/21 1455  BP: (!) 174/100  Pulse: 69  Temp: 98 F (36.7 C)  TempSrc: Oral  SpO2: 99%  Weight: 192 lb (87.1 kg)  Height: '5\' 6"'$  (1.676 m)   Body mass index is 30.99 kg/m.   Physical exam: General/constitutional: no distress, pleasant HEENT: Normocephalic, PER, Conj Clear, EOMI, Oropharynx clear Neck supple CV: rrr no mrg Lungs: clear to auscultation, normal respiratory effort Abd: Soft, Nontender; no fluid wave; no caput medusa Ext: no edema Skin: No Rash Neuro: nonfocal MSK: no peripheral joint swelling/tenderness/warmth; back spines nontender    Microbiology: No results found for this or any previous visit (from the past 240 hour(s)).  Jabier Mutton, [place] for Infectious [place] [phone] pager   [phone] cell 12/19/2021, 3:04 PM

## 2021-12-19 NOTE — Patient Instructions (Addendum)
Will repeat all hepatitis b labs for you  Even if we don't need treatment at this time, you'll need to have these blood test done at least every 6 months.   Will order liver ultrasound to screen for liver cancer as well as determine if we need to treat  See [place] in 1 month to review lab/imaging result and discuss if treatment needed   Please cancel your other liver imaging that was setup; this one doesn't have the thing that I need  Thank you

## 2021-12-22 LAB — HEPATITIS B SURFACE ANTIBODY, QUANTITATIVE: Hep B S AB Quant (Post): <5 m[IU]/mL — ABNORMAL LOW (ref >=10)

## 2021-12-22 LAB — PROTIME-INR
INR: 1
Prothrombin Time: 10.9 s (ref 9.0–11.5)

## 2021-12-22 LAB — CBC
HCT: 42.9 % (ref 38.5–50.0)
Hemoglobin: 13.9 g/dL (ref 13.2–17.1)
MCH: 27.5 pg (ref 27.0–33.0)
MCHC: 32.4 g/dL (ref 32.0–36.0)
MCV: 84.8 fL (ref 80.0–100.0)
MPV: 11.8 fL (ref 7.5–12.5)
Platelets: 192 10*3/uL (ref 140–400)
RBC: 5.06 10*6/uL (ref 4.20–5.80)
RDW: 13.7 % (ref 11.0–15.0)
WBC: 3.3 10*3/uL — ABNORMAL LOW (ref 3.8–10.8)

## 2021-12-22 LAB — COMPLETE METABOLIC PANEL WITH GFR
AG Ratio: 1.5 (calc) (ref 1.0–2.5)
ALT: 51 U/L — ABNORMAL HIGH (ref 9–46)
AST: 28 U/L (ref 10–35)
Albumin: 4 g/dL (ref 3.6–5.1)
Alkaline phosphatase (APISO): 66 U/L (ref 35–144)
BUN: 11 mg/dL (ref 7–25)
CO2: 28 mmol/L (ref 20–32)
Calcium: 8.9 mg/dL (ref 8.6–10.3)
Chloride: 103 mmol/L (ref 98–110)
Creat: 1.15 mg/dL (ref 0.70–1.35)
Globulin: 2.6 g/dL (calc) (ref 1.9–3.7)
Glucose, Bld: 107 mg/dL — ABNORMAL HIGH (ref 65–99)
Potassium: 3.9 mmol/L (ref 3.5–5.3)
Sodium: 139 mmol/L (ref 135–146)
Total Bilirubin: 0.8 mg/dL (ref 0.2–1.2)
Total Protein: 6.6 g/dL (ref 6.1–8.1)
eGFR: 71 mL/min/{1.73_m2} (ref >=60)

## 2021-12-22 LAB — HEPATITIS B DNA, ULTRAQUANTITATIVE, PCR
Hepatitis B DNA: 40000000 IU/mL — ABNORMAL HIGH
Hepatitis B virus DNA: 7.6 Log IU/mL — ABNORMAL HIGH

## 2021-12-22 LAB — AFP TUMOR MARKER: AFP-Tumor Marker: 5.5 ng/mL (ref <6.1)

## 2021-12-22 LAB — HEPATITIS B E ANTIBODY: Hep B E Ab: NONREACTIVE

## 2021-12-22 LAB — HEPATITIS B SURFACE ANTIGEN: Hepatitis B Surface Ag: REACTIVE — AB

## 2021-12-22 LAB — HEPATITIS B E ANTIGEN: Hep B E Ag: REACTIVE — AB

## 2021-12-26 ENCOUNTER — Ambulatory Visit
Admission: RE | Admit: 2021-12-26 | Discharge: 2021-12-26 | Disposition: A | Discharge status: Home / Self Care | Payer: Managed Care, Other (non HMO) | Source: Ambulatory Visit | Attending: Internal Medicine

## 2021-12-26 ENCOUNTER — Other Ambulatory Visit: Payer: Self-pay

## 2021-12-26 DIAGNOSIS — B180 Chronic viral hepatitis B with delta-agent: Secondary | ICD-10-CM

## 2022-01-17 ENCOUNTER — Encounter: Payer: Self-pay | Admitting: Internal Medicine

## 2022-01-17 ENCOUNTER — Ambulatory Visit (INDEPENDENT_AMBULATORY_CARE_PROVIDER_SITE_OTHER): Payer: Managed Care, Other (non HMO) | Admitting: Internal Medicine

## 2022-01-17 ENCOUNTER — Other Ambulatory Visit: Payer: Self-pay

## 2022-01-17 VITALS — BP 157/90 | HR 67 | Temp 97.7°F | Ht 66.0 in | Wt 190.0 lb

## 2022-01-17 DIAGNOSIS — B181 Chronic viral hepatitis B without delta-agent: Secondary | ICD-10-CM | POA: Diagnosis not present

## 2022-01-17 NOTE — Patient Instructions (Addendum)
You have chronic hepatitis b  However, it is silent right now (no liver inflammation or sign of injury). We will need though to monitor these numbers every 6 months. And also we will need once a year liver imaging to screen for cancer which is induced by hepatitis b even if it doesn't cause inflammation   So, do labs in 2nd week of April and see me first week of may 2024

## 2022-01-17 NOTE — Progress Notes (Signed)
[facility] for Infectious Disease  Reason for Consult: Chronic hepatitis B Referring Physician: Dr. Deland Pretty  Assessment: He has chronic active hepatitis B. He previously see dr Megan Salon.   No clear family hx but he thinks his mother died from hepatitis  He was told he has hep b since 95s   I reviewed his most recent labs from 2018: Hep a ab reactive Hep c ab nonreactive Hep d ab nonreactive Hep B sAg positive Fibrosis score 0.41  12/2017 hep b dna 88 million; lft  He has a liver biopsy in 2009 but I can't find the record of it on epic  His most recent lft 2023 is rising but unclear if due to meds, weight gain/diabetes. He doesn't drink alcohol  -------- 01/17/22 id assessment Chronic hep b; eAg positive Recent labs indicate no need for tx in terms of alt <2x unl, no sign of advance fibrosis on elastography, dna high but ok with eAg positive   Plan:  F/u 6 months with repeat hep b lab Need annual liver US hcc screening Advise good lifestyle/healthy diet to avoid nash  I have spent a total of 20 minutes of face-to-face and non-face-to-face time, excluding clinical staff time, preparing to see patient, ordering tests and/or medications, and provide counseling the patient    Patient Active Problem List   Diagnosis Date Noted   Prostate cancer ([place]) 07/17/2019   Leukopenia 12/24/2017   Elevated PSA 12/05/2017   Hypercholesterolemia 12/05/2017   Chronic viral hepatitis B without delta-agent ([place]) 11/20/2016   UTI (urinary tract infection) 08/05/2013    Patient's Medications  New Prescriptions   No medications on file  Previous Medications   AMLODIPINE (NORVASC) 5 MG TABLET    Take 5 mg by mouth every evening.    ASPIRIN EC 81 MG TABLET    1 tablet Orally Once a day for 360 day(s)   HYDROCHLOROTHIAZIDE (HYDRODIURIL) 25 MG TABLET    Take 12.5 mg by mouth daily.   HYDROCODONE-ACETAMINOPHEN (NORCO) 5-325 MG TABLET    Take 1-2 tablets by mouth  every 6 (six) hours as needed for moderate pain.   OLMESARTAN (BENICAR) 40 MG TABLET    Take 40 mg by mouth daily.   ROSUVASTATIN (CRESTOR) 10 MG TABLET    Take 10 mg by mouth every evening.    SULFAMETHOXAZOLE-TRIMETHOPRIM (BACTRIM DS) 800-160 MG TABLET    Take 1 tablet by mouth 2 (two) times daily. Start the day prior to foley removal appointment  Modified Medications   No medications on file  Discontinued Medications   No medications on file    HPI: Tony Kim is a 66 y.o. male who is referred to me for evaluation and management of chronic hepatitis B. He states that he first learned of his hepatitis B Infection in the 1980s. He believes he was infected by his mother who died this past 2022-08-26. She had hepatitis B infection. He denies other risk fctors. He says that he has never had any complications of his infection that he is aware of. He has never been on treatment. He says that his wife has been tested and is hepatitis B negative.He is not sure if she has ever been vaccinated. He works in Pharmacist, hospital for 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  ------ 12/19/21 clinica assessment He is referred here again to f/u on hep b, by his pcp He has mild  lft elevation 2-3 times unl (which is 30 for male) the past month He is at baseline health as he last saw dr Megan Salon in 2018  No fatigue, no new polyarthralgia No fluid in belly No rash No kidney issue No LE edema No heart failure  I asked him about his family history for liver disease; he thinks his mom died from hepatitis but he is not sure    Review of Systems: All other ros negative    Past Medical History:  Diagnosis Date   Allergic rhinitis    BPH (benign prostatic hyperplasia)    Mild   GERD (gastroesophageal reflux disease)    Hepatitis B    Hyperlipidemia    Hypertension    Prostate cancer ([place])    Thyroid nodule     Social History   Tobacco Use    Smoking status: Never   Smokeless tobacco: Never  Vaping Use   Vaping Use: Never used  Substance Use Topics   Alcohol use: Not Currently    Alcohol/week: 2.0 standard drinks of alcohol    Types: 2 Standard drinks or equivalent per week   Drug use: No    Family History  Problem Relation Age of Onset   Hyperlipidemia Mother    Hyperlipidemia Father    No Known Allergies  OBJECTIVE: Vitals:   01/17/22 1610  BP: (!) 171/100  Pulse: 67  Temp: 97.7 F (36.5 C)  TempSrc: Oral  SpO2: 99%  Weight: 190 lb (86.2 kg)  Height: _0  (1.676 m)    Body mass index is 30.67 kg/m.   Physical exam: General/constitutional: no distress, pleasant HEENT: Normocephalic, PER, Conj Clear, EOMI, Oropharynx clear Neck supple CV: rrr no mrg Lungs: clear to auscultation, normal respiratory effort Abd: Soft, Nontender Ext: no edema Skin: No Rash Neuro: nonfocal MSK: no peripheral joint swelling/tenderness/warmth; back spines nontender  Labs: Lab Results  Component Value Date   WBC 3.3 (L) 12/19/2021   HGB 13.9 12/19/2021   HCT 42.9 12/19/2021   MCV 84.8 12/19/2021   PLT 192 51/76/1607   Last metabolic panel Lab Results  Component Value Date   GLUCOSE 107 (H) 12/19/2021   NA 139 12/19/2021   K 3.9 12/19/2021   CL 103 12/19/2021   CO2 28 12/19/2021   BUN 11 12/19/2021   CREATININE 1.15 12/19/2021   EGFR 71 12/19/2021   CALCIUM 8.9 12/19/2021   PROT 6.6 12/19/2021   ALBUMIN 4.1 07/17/2019   BILITOT 0.8 12/19/2021   ALKPHOS 50 07/17/2019   AST 28 12/19/2021   ALT 51 (H) 12/19/2021   ANIONGAP 3 (L) 07/18/2019     Microbiology: No results found for this or any previous visit (from the past 240 hour(s)).   Imaging: Personally reviewed/incorporated into decision making  12/26/21 liver elastography IMPRESSION: ULTRASOUND ABDOMEN:   Suspected fatty infiltration of liver as above.   Inadequate visualization of IVC, aorta, and pancreas due to bowel gas and body  habitus.   ULTRASOUND HEPATIC ELASTOGRAPHY:   Median kPa:  5.4   Diagnostic category: < or = 9 kPa: in the absence of other known clinical signs, rules out [place], [place] for [place] 336 [phone] pager   [phone] cell 01/17/2022, 4:09 PM

## 2022-06-08 IMAGING — CT CT CARDIAC CORONARY ARTERY CALCIUM SCORE
3 series · 14 of 20 positions shown, 16 images · non-contrast
Comparison: None.

CLINICAL DATA: 65-year-old African American male with history of
hyperlipidemia and hypertension.

EXAM:
CT CARDIAC CORONARY ARTERY CALCIUM SCORE
TECHNIQUE: Non-contrast imaging through the heart was performed using
prospective ECG gating. Image post processing was performed on an
independent workstation, allowing for quantitative analysis of the
heart and coronary arteries. Note that this exam targets the heart
and the chest was not imaged in its entirety.

[Series 2: calcium scoring 2.00 qr36 bestdiast 69% hrt calciu · axial · 0.40mm/px · z∈[+1836,+1920]mm · 4 of 70 slices shown]
[im 14/70  vessel]
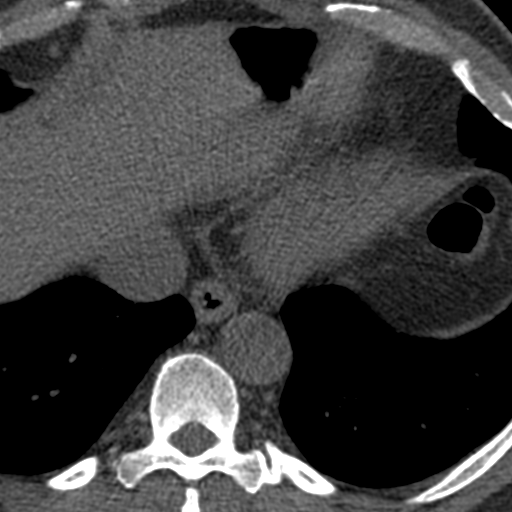
[im 28/70  vessel]
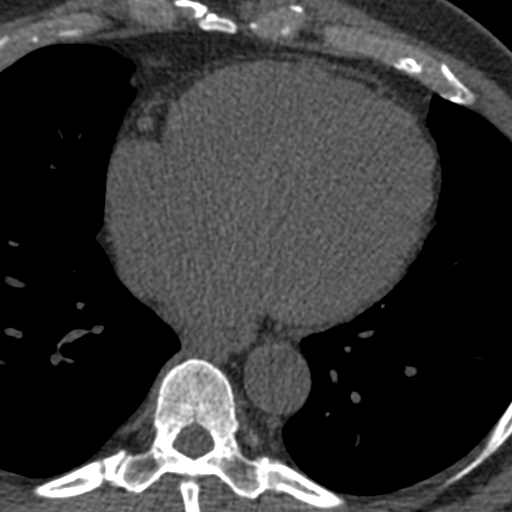
[im 42/70  vessel]
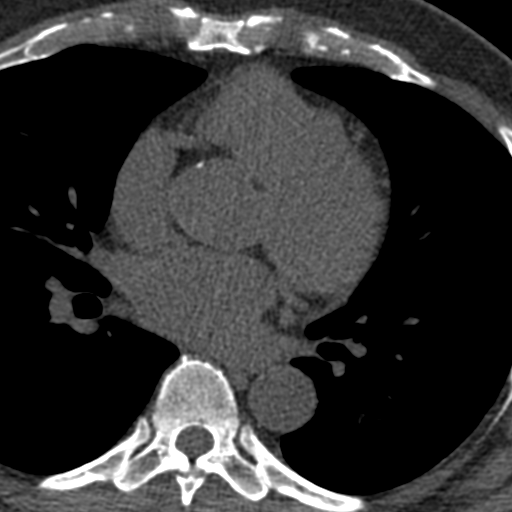
[im 56/70  vessel]
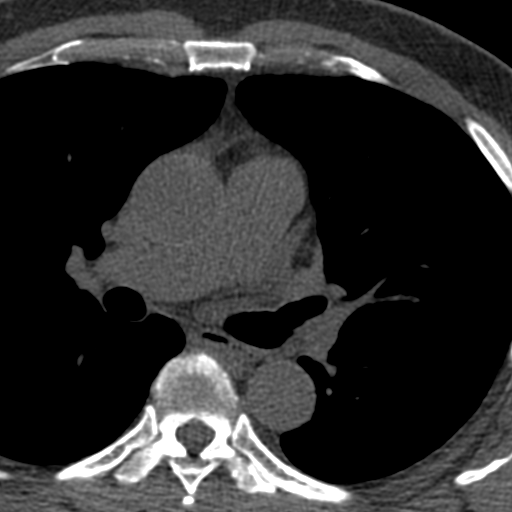

[Series 3: calcium scoring 2.00 br40 bestdiast 69% axial · axial · 0.56mm/px · z∈[+1832,+1924]mm · 5 of 70 slices shown, 7 images]
[im 12/70  vessel]
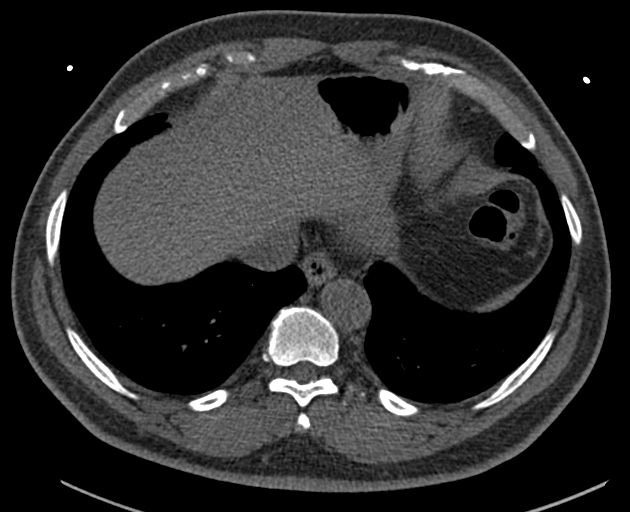
[im 12/70  lung]
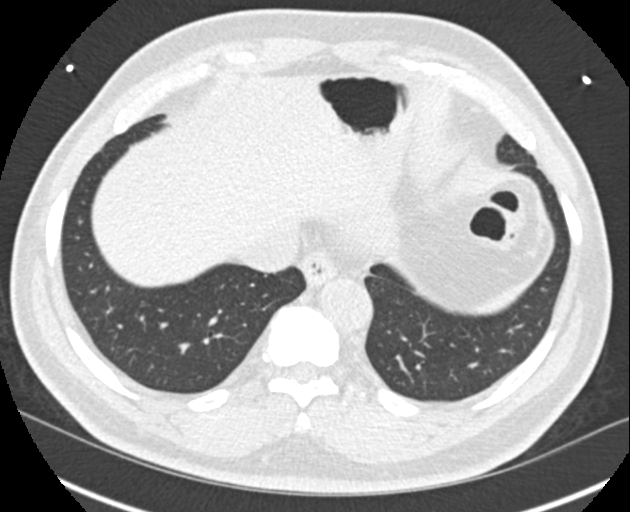
[im 24/70  vessel]
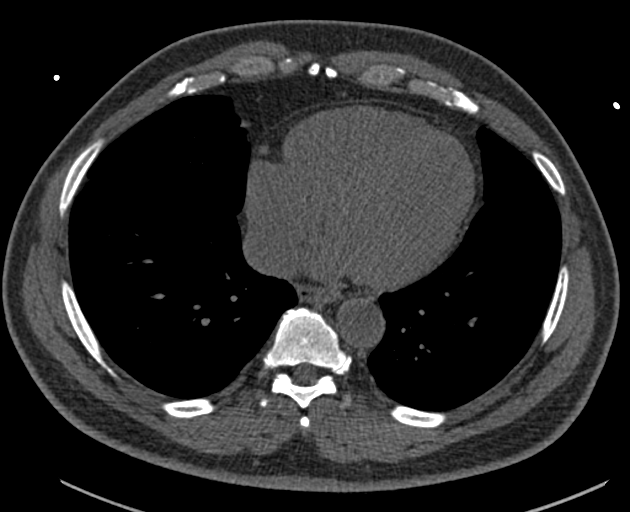
[im 35/70  vessel]
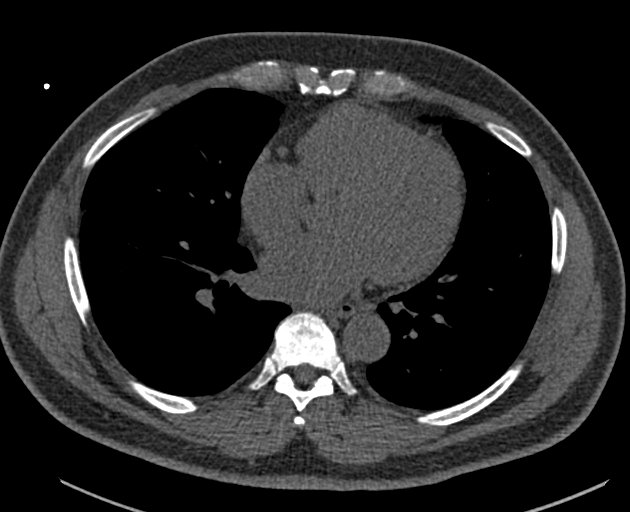
[im 47/70  vessel]
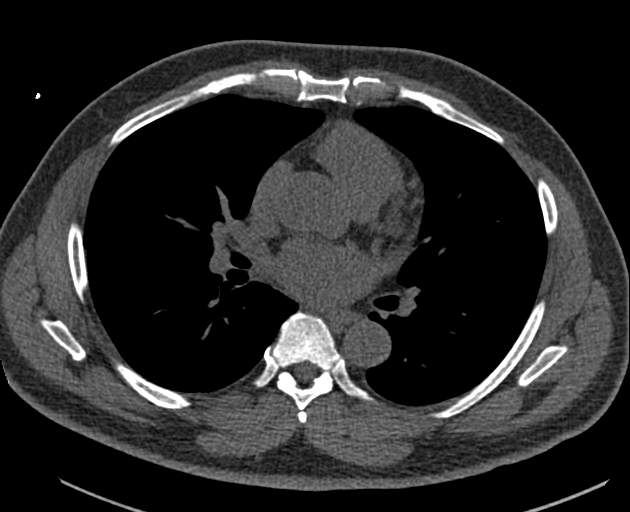
[im 58/70  vessel]
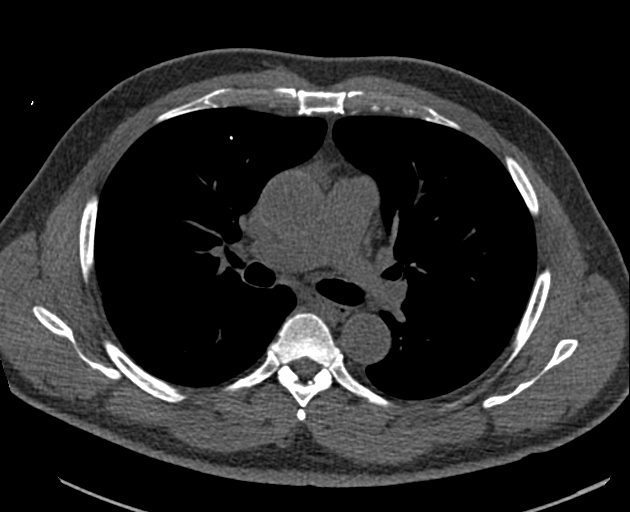
[im 58/70  lung]
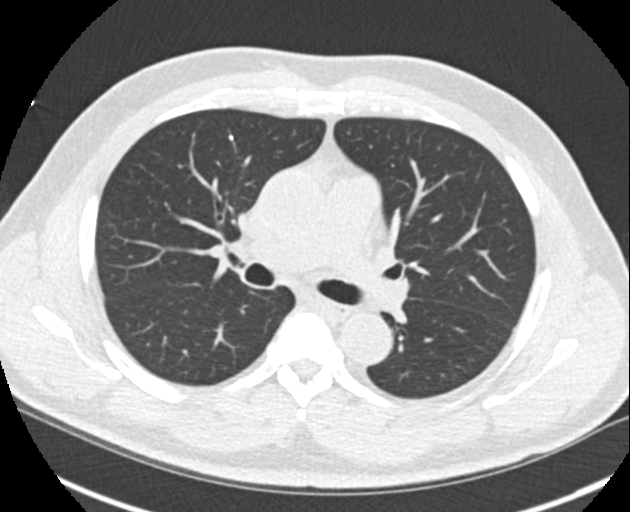

[Series 9: calcium scoring 2.00 br60 bestdiast 69% lungs · axial · 0.58mm/px · z∈[+1832,+1924]mm · 5 of 70 slices shown]
[im 12/70  vessel]
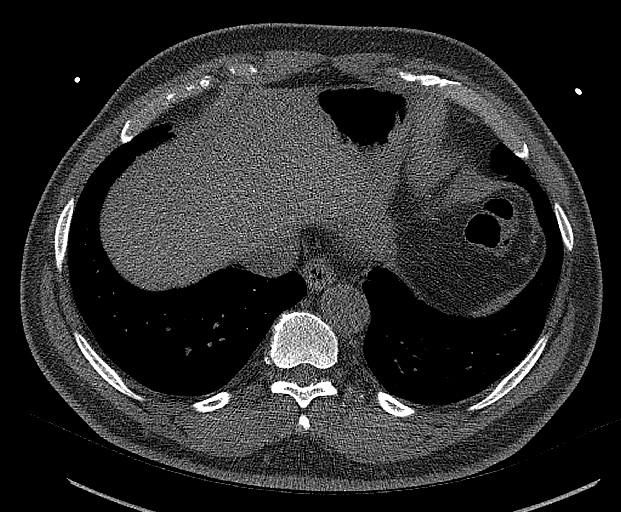
[im 24/70  vessel]
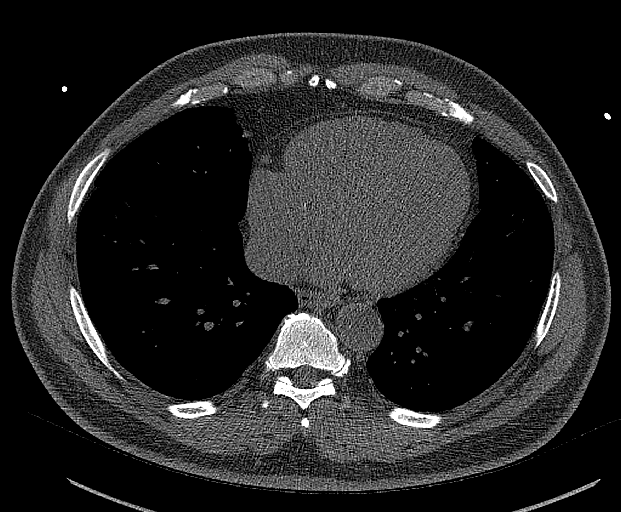
[im 35/70  vessel]
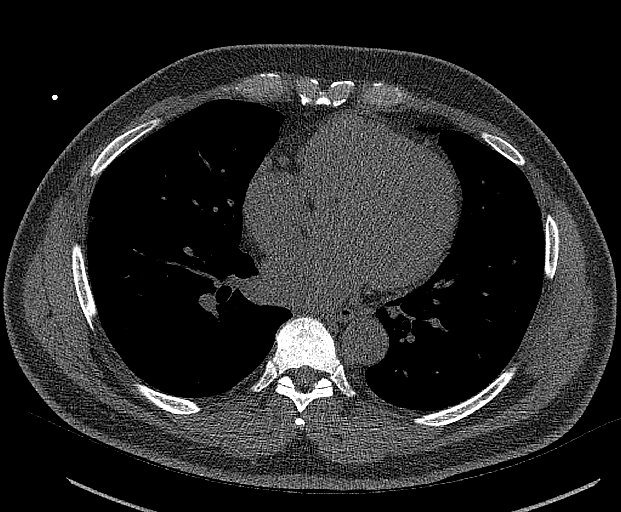
[im 47/70  vessel]
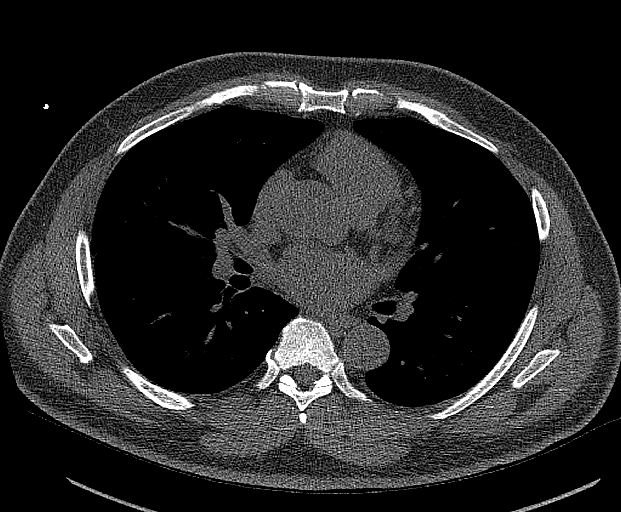
[im 58/70  vessel]
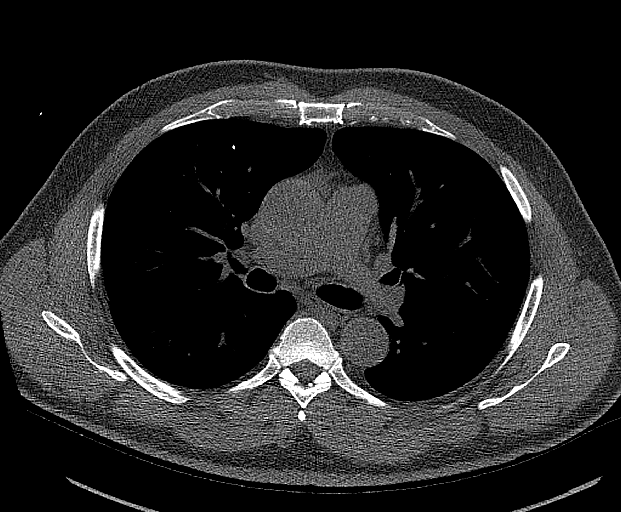

[14 of 20 positions shown; findings below may reference images not displayed]

FINDINGS: CORONARY CALCIUM SCORES:

Left Main: 0

LAD: 104

LCx:

RCA:

Total Agatston Score: 114

[HOSPITAL] percentile: 79

AORTA MEASUREMENTS:

Ascending Aorta: 34 mm

Descending Aorta: 26 mm

OTHER FINDINGS:

The heart size is within normal limits. No pericardial fluid is
identified. Visualized segments of the thoracic aorta and central
pulmonary arteries are normal in caliber. Visualized mediastinum and
hilar regions demonstrate no lymphadenopathy or masses. There is a
small calcified granuloma in the right upper lobe. Lower right
paratracheal lymph node contains focal calcification. Visualized
lungs show no evidence of pulmonary edema, consolidation,
pneumothorax or pleural fluid. Visualized upper abdomen and bony
structures are unremarkable.
IMPRESSION: 1. Coronary calcium score 114 is at the 79th percentile for the
patient's age, sex and race.
2. Evidence of prior granulomatous disease with right upper lobe
calcified granuloma and calcified right paratracheal lymph node.

## 2022-06-28 ENCOUNTER — Other Ambulatory Visit: Payer: Self-pay

## 2022-06-28 ENCOUNTER — Other Ambulatory Visit: Payer: Managed Care, Other (non HMO)

## 2022-06-28 DIAGNOSIS — B181 Chronic viral hepatitis B without delta-agent: Secondary | ICD-10-CM

## 2022-06-29 LAB — HEPATITIS B E ANTIBODY: Hep B E Ab: NONREACTIVE

## 2022-06-29 LAB — COMPLETE METABOLIC PANEL WITH GFR
AG Ratio: 1.7 (calc) (ref 1.0–2.5)
ALT: 56 U/L — ABNORMAL HIGH (ref 9–46)
AST: 29 U/L (ref 10–35)
Albumin: 4.1 g/dL (ref 3.6–5.1)
Alkaline phosphatase (APISO): 65 U/L (ref 35–144)
BUN: 16 mg/dL (ref 7–25)
CO2: 32 mmol/L (ref 20–32)
Calcium: 9.3 mg/dL (ref 8.6–10.3)
Chloride: 98 mmol/L (ref 98–110)
Creat: 1.2 mg/dL (ref 0.70–1.35)
Globulin: 2.4 g/dL (calc) (ref 1.9–3.7)
Glucose, Bld: 108 mg/dL — ABNORMAL HIGH (ref 65–99)
Potassium: 3.6 mmol/L (ref 3.5–5.3)
Sodium: 139 mmol/L (ref 135–146)
Total Bilirubin: 0.8 mg/dL (ref 0.2–1.2)
Total Protein: 6.5 g/dL (ref 6.1–8.1)
eGFR: 67 mL/min/{1.73_m2} (ref >=60)

## 2022-06-29 LAB — HEPATITIS B SURFACE ANTIGEN: Hepatitis B Surface Ag: REACTIVE — AB

## 2022-06-29 LAB — HEPATITIS B SURFACE ANTIBODY, QUANTITATIVE: Hep B S AB Quant (Post): <5 m[IU]/mL — ABNORMAL LOW (ref >=10)

## 2022-06-29 LAB — HEPATITIS B E ANTIGEN: Hep B E Ag: REACTIVE — AB

## 2022-07-12 ENCOUNTER — Encounter: Payer: Self-pay | Admitting: Internal Medicine

## 2022-07-12 ENCOUNTER — Other Ambulatory Visit (HOSPITAL_COMMUNITY): Payer: Self-pay

## 2022-07-12 ENCOUNTER — Other Ambulatory Visit: Payer: Self-pay

## 2022-07-12 ENCOUNTER — Ambulatory Visit (INDEPENDENT_AMBULATORY_CARE_PROVIDER_SITE_OTHER): Payer: Managed Care, Other (non HMO) | Admitting: Internal Medicine

## 2022-07-12 VITALS — BP 143/82 | HR 82 | Resp 16 | Ht 66.0 in | Wt 189.0 lb

## 2022-07-12 DIAGNOSIS — B181 Chronic viral hepatitis B without delta-agent: Secondary | ICD-10-CM | POA: Diagnosis not present

## 2022-07-12 MED ORDER — ENTECAVIR 0.5 MG PO TABS: 0.5000 mg | ORAL_TABLET | Freq: Every day | ORAL | 11 refills | Status: DC

## 2022-07-12 MED ORDER — ENTECAVIR 0.5 MG PO TABS
0.5000 mg | ORAL_TABLET | Freq: Every day | ORAL | 11 refills | Status: DC
  Filled 2022-07-12: qty 30, 30d supply, fill #0

## 2022-07-12 NOTE — Patient Instructions (Signed)
Because of high hepatitis virus level, will start you on entecavir. You'll take 1 tablet daily for rest of your life   Will plan to do liver ultrasound later this year   See me in 3 months for lab testing again. If ok will do every 6 months

## 2022-07-12 NOTE — Progress Notes (Signed)
[facility]  Reason for Consult: Chronic hepatitis B Referring Physician: Dr. Merri Brunette  Assessment: He has chronic active hepatitis B. He previously see dr Orvan Falconer.   No clear family hx but he thinks his mother died from hepatitis  He was told he has hep b since 60s   I reviewed his most recent labs from 2018: Hep a ab reactive Hep c ab nonreactive Hep d ab nonreactive Hep B sAg positive Fibrosis score 0.41  12/2017 hep b dna 88 million; lft  He has a liver biopsy in 2009 but I can't find the record of it on epic  His most recent lft 2023 is rising but unclear if due to meds, weight gain/diabetes. He doesn't drink alcohol  -------- 01/17/22 id assessment Chronic hep b; eAg positive Recent labs indicate no need for tx in terms of alt <2x unl, no sign of advance fibrosis on elastography, dna high but ok with eAg positive   07/12/22 id assessment I spoke with gi previously and due to high hep b dna level patient at risk for progression of complication and will start entecavir      Plan:  Will review labs today once available (hep b dna) Start entecavir 0.5 mg daily --> to send to his approved specialty pharmacy [facility] and that will mail to him [facility] will cost 14 dollars a month; patient will see what copay is with [facility] and let us know F/u 3 months for labs draw Need liver US hcc screening later this year Advise good lifestyle/healthy diet to avoid nash   I have spent a total of 30 minutes of face-to-face and non-face-to-face time, excluding clinical staff time, preparing to see patient, ordering tests and/or medications, and provide counseling the patient    Patient Active Problem List   Diagnosis Date Noted   Prostate cancer (HCC) 07/17/2019   Leukopenia 12/24/2017   Elevated PSA 12/05/2017   Hypercholesterolemia 12/05/2017   Chronic viral hepatitis B without delta-agent (HCC) 11/20/2016   UTI  (urinary tract infection) 08/05/2013    Patient's Medications  New Prescriptions   No medications on file  Previous Medications   AMLODIPINE (NORVASC) 5 MG TABLET    Take 5 mg by mouth every evening.    ASPIRIN EC 81 MG TABLET    1 tablet Orally Once a day for 360 day(s)   HYDROCHLOROTHIAZIDE (HYDRODIURIL) 25 MG TABLET    Take 12.5 mg by mouth daily.   HYDROCODONE-ACETAMINOPHEN (NORCO) 5-325 MG TABLET    Take 1-2 tablets by mouth every 6 (six) hours as needed for moderate pain.   OLMESARTAN (BENICAR) 40 MG TABLET    Take 40 mg by mouth daily.   ROSUVASTATIN (CRESTOR) 10 MG TABLET    Take 10 mg by mouth every evening.    SULFAMETHOXAZOLE-TRIMETHOPRIM (BACTRIM DS) 800-160 MG TABLET    Take 1 tablet by mouth 2 (two) times daily. Start the day prior to foley removal appointment  Modified Medications   No medications on file  Discontinued Medications   No medications on file    HPI: Tony Kim is a 67 y.o. male who is referred to me for evaluation and management of chronic hepatitis B. He states that he first learned of his hepatitis B Infection in the 1980s. He believes he was infected by his mother who died this past 09/08/22. She had hepatitis B infection. He denies other risk fctors. He says that  he has never had any complications of his infection that he is aware of. He has never been on treatment. He says that his wife has been tested and is hepatitis B negative.He is not sure if she has ever been vaccinated. 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  ------ 12/19/21 clinica assessment He is referred here again to f/u on hep b, by his pcp He has mild lft elevation 2-3 times unl (which is 30 for male) the past month He is at baseline health as he last saw dr Orvan Falconer in 2018  No fatigue, no new polyarthralgia No fluid in belly No rash No kidney issue No LE edema No heart failure  I asked him about  his family history for liver disease; he thinks his mom died from hepatitis but he is not sure   ------------ 07/12/22 id clinic f/u Doing well Get labs draw again  I discussed plan about hep b tx with him; GI had contacted [place] about high level hep dna and advise tx    Review of Systems: All other ros negative    Past Medical History:  Diagnosis Date   Allergic rhinitis    BPH (benign prostatic hyperplasia)    Mild   GERD (gastroesophageal reflux disease)    Hepatitis B    Hyperlipidemia    Hypertension    Prostate cancer (HCC)    Thyroid nodule     Social History   Tobacco Use   Smoking status: Never   Smokeless tobacco: Never  Vaping Use   Vaping Use: Never used  Substance Use Topics   Alcohol use: Not Currently    Alcohol/week: 2.0 standard drinks of alcohol    Types: 2 Standard drinks or equivalent per week   Drug use: No    Family History  Problem Relation Age of Onset   Hyperlipidemia Mother    Hyperlipidemia Father    No Known Allergies  OBJECTIVE: Vitals:   07/12/22 1607 07/12/22 1616  BP: (!) 146/82 (!) 143/82  Pulse: 82   Resp: 16   SpO2: 98%   Weight: 189 lb (85.7 kg)   Height: 5\' 6"  (1.676 m)      There is no height or weight on file to calculate BMI.   Physical exam: General/constitutional: no distress, pleasant HEENT: Normocephalic, PER, Conj Clear, EOMI, Oropharynx clear Neck supple CV: rrr no mrg Lungs: clear to auscultation, normal respiratory effort Abd: Soft, Nontender Ext: no edema Skin: No Rash Neuro: nonfocal MSK: no peripheral joint swelling/tenderness/warmth; back spines nontender    Labs: Lab Results  Component Value Date   WBC 3.3 (L) 12/19/2021   HGB 13.9 12/19/2021   HCT 42.9 12/19/2021   MCV 84.8 12/19/2021   PLT 192 12/19/2021   Last metabolic panel Lab Results  Component Value Date   GLUCOSE 108 (H) 06/28/2022   NA 139 06/28/2022   K 3.6 06/28/2022   CL 98 06/28/2022   CO2 32 06/28/2022   BUN  16 06/28/2022   CREATININE 1.20 06/28/2022   EGFR 67 06/28/2022   CALCIUM 9.3 06/28/2022   PROT 6.5 06/28/2022   ALBUMIN 4.1 07/17/2019   BILITOT 0.8 06/28/2022   ALKPHOS 50 07/17/2019   AST 29 06/28/2022   ALT 56 (H) 06/28/2022   ANIONGAP 3 (L) 07/18/2019     Microbiology: No results found for this or any previous visit (from the past 240 hour(s)).   Imaging:  Personally reviewed/incorporated into decision making  12/26/21 liver elastography IMPRESSION: ULTRASOUND ABDOMEN:   Suspected fatty infiltration of liver as above.   Inadequate visualization of IVC, aorta, and pancreas due to bowel gas and body habitus.   ULTRASOUND HEPATIC ELASTOGRAPHY:   Median kPa:  5.4   Diagnostic category: < or = 9 kPa: in the absence of other known clinical signs, rules out cACLD   Raymondo Band, MD [facility] [phone] pager   [phone] cell 07/12/2022, 4:04 PM

## 2022-07-15 LAB — HEPATITIS B DNA, ULTRAQUANTITATIVE, PCR
Hepatitis B DNA: 28000000 IU/mL — ABNORMAL HIGH
Hepatitis B virus DNA: 7.45 Log IU/mL — ABNORMAL HIGH

## 2022-10-10 ENCOUNTER — Other Ambulatory Visit: Payer: Self-pay

## 2022-10-10 ENCOUNTER — Ambulatory Visit (INDEPENDENT_AMBULATORY_CARE_PROVIDER_SITE_OTHER): Payer: Managed Care, Other (non HMO) | Admitting: Internal Medicine

## 2022-10-10 VITALS — BP 132/75 | HR 76 | Temp 97.6°F | Wt 192.0 lb

## 2022-10-10 DIAGNOSIS — B181 Chronic viral hepatitis B without delta-agent: Secondary | ICD-10-CM

## 2022-10-10 LAB — CBC
HCT: 41.2 % (ref 38.5–50.0)
Hemoglobin: 13.4 g/dL (ref 13.2–17.1)
MCH: 27.7 pg (ref 27.0–33.0)
MCHC: 32.5 g/dL (ref 32.0–36.0)
MCV: 85.1 fL (ref 80.0–100.0)
MPV: 11.5 fL (ref 7.5–12.5)
Platelets: 198 10*3/uL (ref 140–400)
RBC: 4.84 10*6/uL (ref 4.20–5.80)
RDW: 13.5 % (ref 11.0–15.0)
WBC: 3.4 10*3/uL — ABNORMAL LOW (ref 3.8–10.8)

## 2022-10-10 NOTE — Progress Notes (Signed)
[facility]  Reason for Consult: Chronic hepatitis B Referring Physician: Dr. Merri Brunette  Assessment: He has chronic active hepatitis B. He previously see dr Orvan Falconer.   No clear family hx but he thinks his mother died from hepatitis  He was told he has hep b since 82s   I reviewed his most recent labs from 2018: Hep a ab reactive Hep c ab nonreactive Hep d ab nonreactive Hep B sAg positive Fibrosis score 0.41  12/2017 hep b dna 88 million; lft  He has a liver biopsy in 2009 but I can't find the record of it on epic  His most recent lft 2023 is rising but unclear if due to meds, weight gain/diabetes. He doesn't drink alcohol  -------- 01/17/22 id assessment Chronic hep b; eAg positive Recent labs indicate no need for tx in terms of alt <2x unl, no sign of advance fibrosis on elastography, dna high but ok with eAg positive   07/12/22 id assessment I spoke with gi previously and due to high hep b dna level patient at risk for progression of complication and will start entecavir   10/10/22 id assessment Tolerating entecavir  He has no side effect to the entecavir - no missed dose last 4 weeks Will get labs today and annual ultrasound F/u 6 months     Patient Active Problem List   Diagnosis Date Noted   Prostate cancer (HCC) 07/17/2019   Leukopenia 12/24/2017   Elevated PSA 12/05/2017   Hypercholesterolemia 12/05/2017   Chronic viral hepatitis B without delta-agent (HCC) 11/20/2016   UTI (urinary tract infection) 08/05/2013    Patient's Medications  New Prescriptions   No medications on file  Previous Medications   AMLODIPINE (NORVASC) 5 MG TABLET    Take 5 mg by mouth every evening.    ASPIRIN EC 81 MG TABLET    1 tablet Orally Once a day for 360 day(s)   ENTECAVIR (BARACLUDE) 0.5 MG TABLET    Take 1 tablet (0.5 mg total) by mouth daily.   HYDROCHLOROTHIAZIDE (HYDRODIURIL) 25 MG TABLET    Take 12.5 mg by mouth daily.    HYDROCODONE-ACETAMINOPHEN (NORCO) 5-325 MG TABLET    Take 1-2 tablets by mouth every 6 (six) hours as needed for moderate pain.   OLMESARTAN (BENICAR) 40 MG TABLET    Take 40 mg by mouth daily.   ROSUVASTATIN (CRESTOR) 10 MG TABLET    Take 10 mg by mouth every evening.    SULFAMETHOXAZOLE-TRIMETHOPRIM (BACTRIM DS) 800-160 MG TABLET    Take 1 tablet by mouth 2 (two) times daily. Start the day prior to foley removal appointment  Modified Medications   No medications on file  Discontinued Medications   No medications on file    HPI: Tony Kim is a 67 y.o. male who is referred to me for evaluation and management of chronic hepatitis B. He states that he first learned of his hepatitis B Infection in the 1980s. He believes he was infected by his mother who died this past 2022/08/28. She had hepatitis B infection. He denies other risk fctors. He says that he has never had any complications of his infection that he is aware of. He has never been on treatment. He says that his wife has been tested and is hepatitis B negative.He is not sure if she has ever been vaccinated. 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  ------ 12/19/21 clinica assessment He is referred here again to f/u on hep b, by his pcp He has mild lft elevation 2-3 times unl (which is 30 for male) the past month He is at baseline health as he last saw dr Orvan Falconer in 2018  No fatigue, no new polyarthralgia No fluid in belly No rash No kidney issue No LE edema No heart failure  I asked him about his family history for liver disease; he thinks his mom died from hepatitis but he is not sure   ------------ 07/12/22 id clinic f/u Doing well Get labs draw again  I discussed plan about hep b tx with him; GI had contacted [place] about high level hep dna and advise tx  10/10/22 id clinic f/u See a&p  Review of Systems: All other ros negative    Past  Medical History:  Diagnosis Date   Allergic rhinitis    BPH (benign prostatic hyperplasia)    Mild   GERD (gastroesophageal reflux disease)    Hepatitis B    Hyperlipidemia    Hypertension    Prostate cancer (HCC)    Thyroid nodule     Social History   Tobacco Use   Smoking status: Never   Smokeless tobacco: Never  Vaping Use   Vaping status: Never Used  Substance Use Topics   Alcohol use: Not Currently    Alcohol/week: 2.0 standard drinks of alcohol    Types: 2 Standard drinks or equivalent per week   Drug use: No    Family History  Problem Relation Age of Onset   Hyperlipidemia Mother    Hyperlipidemia Father    No Known Allergies  OBJECTIVE: Vitals:   10/10/22 1600  BP: 132/75  Pulse: 76  Temp: 97.6 F (36.4 C)  TempSrc: Temporal  SpO2: 97%  Weight: 192 lb (87.1 kg)      There is no height or weight on file to calculate BMI.  Physical exam: General/constitutional: no distress, pleasant HEENT: Normocephalic, PER, Conj Clear, EOMI, Oropharynx clear Neck supple CV: rrr no mrg Lungs: clear to auscultation, normal respiratory effort Abd: Soft, Nontender Ext: no edema Skin: No Rash Neuro: nonfocal MSK: no peripheral joint swelling/tenderness/warmth; back spines nontender     Labs: Lab Results  Component Value Date   WBC 3.3 (L) 12/19/2021   HGB 13.9 12/19/2021   HCT 42.9 12/19/2021   MCV 84.8 12/19/2021   PLT 192 12/19/2021   Last metabolic panel Lab Results  Component Value Date   GLUCOSE 108 (H) 06/28/2022   NA 139 06/28/2022   K 3.6 06/28/2022   CL 98 06/28/2022   CO2 32 06/28/2022   BUN 16 06/28/2022   CREATININE 1.20 06/28/2022   EGFR 67 06/28/2022   CALCIUM 9.3 06/28/2022   PROT 6.5 06/28/2022   ALBUMIN 4.1 07/17/2019   BILITOT 0.8 06/28/2022   ALKPHOS 50 07/17/2019   AST 29 06/28/2022   ALT 56 (H) 06/28/2022   ANIONGAP 3 (L) 07/18/2019    Serology: 07/2022  Hep b DNA  06/2022 Hep b sAb <5; sAg reactive; eAg  reactive; eAb nonreactive  Microbiology: No results found for this or any previous visit (from the past 240 hour(s)).   Imaging: Personally reviewed/incorporated into decision making  12/26/21 liver elastography IMPRESSION: ULTRASOUND ABDOMEN:   Suspected fatty infiltration of liver as above.   Inadequate visualization of IVC, aorta, and pancreas due to bowel gas and body habitus.   ULTRASOUND HEPATIC ELASTOGRAPHY:  Median kPa:  5.4   Diagnostic category: < or = 9 kPa: in the absence of other known clinical signs, rules out cACLD   Raymondo Band, MD [facility] for Infectious Disease [facility] [phone] pager   [phone] cell 10/10/2022, 3:58 PM

## 2022-10-10 NOTE — Patient Instructions (Signed)
Please schedule your ultrasound with the front desk   Labs today   Continue entecavir for your hepatitis b   See me in 6 months

## 2022-10-15 ENCOUNTER — Telehealth: Payer: Self-pay

## 2022-10-15 NOTE — Telephone Encounter (Signed)
-----   Message from Marcus Hook sent at 10/15/2022  3:11 PM EDT ----- Please let him know his viral load is much better on the medication, which he'll need to continue indefinitely. We can see him in 6 months. thanks

## 2022-10-15 NOTE — Telephone Encounter (Signed)
Patient informed of lab results and verbalized understanding.  [name]  

## 2022-10-17 ENCOUNTER — Other Ambulatory Visit (HOSPITAL_COMMUNITY): Payer: Managed Care, Other (non HMO)

## 2022-10-19 ENCOUNTER — Ambulatory Visit
Admission: RE | Admit: 2022-10-19 | Discharge: 2022-10-19 | Disposition: A | Discharge status: Home / Self Care | Payer: Managed Care, Other (non HMO) | Source: Ambulatory Visit | Attending: Internal Medicine | Admitting: Internal Medicine

## 2022-10-19 DIAGNOSIS — B181 Chronic viral hepatitis B without delta-agent: Secondary | ICD-10-CM

## 2023-04-11 ENCOUNTER — Ambulatory Visit: Payer: Managed Care, Other (non HMO) | Admitting: Internal Medicine

## 2023-04-16 ENCOUNTER — Other Ambulatory Visit: Payer: Self-pay

## 2023-04-16 ENCOUNTER — Encounter: Payer: Self-pay | Admitting: Internal Medicine

## 2023-04-16 ENCOUNTER — Ambulatory Visit (INDEPENDENT_AMBULATORY_CARE_PROVIDER_SITE_OTHER): Payer: Managed Care, Other (non HMO) | Admitting: Internal Medicine

## 2023-04-16 VITALS — BP 170/91 | HR 70 | Temp 98.1°F | Wt 191.8 lb

## 2023-04-16 DIAGNOSIS — Z129 Encounter for screening for malignant neoplasm, site unspecified: Secondary | ICD-10-CM

## 2023-04-16 DIAGNOSIS — B181 Chronic viral hepatitis B without delta-agent: Secondary | ICD-10-CM | POA: Diagnosis not present

## 2023-04-16 NOTE — Progress Notes (Signed)
[facility]  Reason for Consult: Chronic hepatitis B Referring Physician: Dr. Merri Brunette  Assessment: He has chronic active hepatitis B. He previously see dr Orvan Falconer.   No clear family hx but he thinks his mother died from hepatitis  He was told he has hep b since 2s   I reviewed his most recent labs from 2018: Hep a ab reactive Hep c ab nonreactive Hep d ab nonreactive Hep B sAg positive Fibrosis score 0.41  12/2017 hep b dna 88 million; lft  He has a liver biopsy in 2009 but I can't find the record of it on epic  His most recent lft 2023 is rising but unclear if due to meds, weight gain/diabetes. He doesn't drink alcohol  -------- 01/17/22 id assessment Chronic hep b; eAg positive Recent labs indicate no need for tx in terms of alt <2x unl, no sign of advance fibrosis on elastography, dna high but ok with eAg positive   07/12/22 id assessment I spoke with gi previously and due to high hep b dna level patient at risk for progression of complication and will start entecavir   10/10/22 id assessment Tolerating entecavir  He has no side effect to the entecavir - no missed dose last 4 weeks Will get labs today and annual ultrasound F/u 6 months   04/16/23 id assessment Life and work is stressful --> 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; patient is Market researcher)  He is doing well on entecavir; no missed dose last 4 weeks Due for repeat labs today 10/2022 limited liver u/s no hepatoma  Patient asks about when he could stop the entecavir -> no family hx liver disease, if the slim chance that he achieve seroconversion with sAb and sAG, and no sign of advance fibrosis, we can stop (obviously will need to do elastography at that time)   -repeat liver u/s biannual hcc screening -hepatitis b labs and basic chemistry/cbc -f/u 6 months -continue entecavir   Patient Active Problem List    Diagnosis Date Noted   Prostate cancer (HCC) 07/17/2019   Leukopenia 12/24/2017   Elevated PSA 12/05/2017   Hypercholesterolemia 12/05/2017   Chronic viral hepatitis B without delta-agent (HCC) 11/20/2016   UTI (urinary tract infection) 08/05/2013    Patient's Medications  New Prescriptions   No medications on file  Previous Medications   AMLODIPINE (NORVASC) 5 MG TABLET    Take 5 mg by mouth every evening.    ASPIRIN EC 81 MG TABLET    1 tablet Orally Once a day for 360 day(s)   ENTECAVIR (BARACLUDE) 0.5 MG TABLET    Take 1 tablet (0.5 mg total) by mouth daily.   HYDROCHLOROTHIAZIDE (HYDRODIURIL) 25 MG TABLET    Take 12.5 mg by mouth daily.   HYDROCODONE-ACETAMINOPHEN (NORCO) 5-325 MG TABLET    Take 1-2 tablets by mouth every 6 (six) hours as needed for moderate pain.   OLMESARTAN (BENICAR) 40 MG TABLET    Take 40 mg by mouth daily.   ROSUVASTATIN (CRESTOR) 10 MG TABLET    Take 10 mg by mouth every evening.    SULFAMETHOXAZOLE-TRIMETHOPRIM (BACTRIM DS) 800-160 MG TABLET    Take 1 tablet by mouth 2 (two) times daily. Start the day prior to foley removal appointment  Modified Medications   No medications on file  Discontinued Medications   No medications on file    HPI: Tony Kim is a 68  y.o. male who is referred to me for evaluation and management of chronic hepatitis B. He states that he first learned of his hepatitis B Infection in the 1980s. He believes he was infected by his mother who died this past 08/30/23. She had hepatitis B infection. He denies other risk fctors. He says that he has never had any complications of his infection that he is aware of. He has never been on treatment. He says that his wife has been tested and is hepatitis B negative.He is not sure if she has ever been vaccinated. 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  ------ 12/19/21 clinica assessment He is referred here  again to f/u on hep b, by his pcp He has mild lft elevation 2-3 times unl (which is 30 for male) the past month He is at baseline health as he last saw dr Orvan Falconer in 2018  No fatigue, no new polyarthralgia No fluid in belly No rash No kidney issue No LE edema No heart failure  I asked him about his family history for liver disease; he thinks his mom died from hepatitis but he is not sure   ------------ 07/12/22 id clinic f/u Doing well Get labs draw again  I discussed plan about hep b tx with him; GI had contacted [place] about high level hep dna and advise tx  04/16/23 See a&p  Review of Systems: All other ros negative    Past Medical History:  Diagnosis Date   Allergic rhinitis    BPH (benign prostatic hyperplasia)    Mild   GERD (gastroesophageal reflux disease)    Hepatitis B    Hyperlipidemia    Hypertension    Prostate cancer (HCC)    Thyroid nodule     Social History   Tobacco Use   Smoking status: Never   Smokeless tobacco: Never  Vaping Use   Vaping status: Never Used  Substance Use Topics   Alcohol use: Not Currently    Alcohol/week: 2.0 standard drinks of alcohol    Types: 2 Standard drinks or equivalent per week   Drug use: No    Family History  Problem Relation Age of Onset   Hyperlipidemia Mother    Hyperlipidemia Father    No Known Allergies  OBJECTIVE: Vitals:   04/16/23 1551  BP: (!) 170/91  Pulse: 70  Temp: 98.1 F (36.7 C)  TempSrc: Oral  SpO2: 99%  Weight: 191 lb 12.8 oz (87 kg)      Body mass index is 30.96 kg/m.  Physical exam: General/constitutional: no distress, pleasant HEENT: Normocephalic, PER, Conj Clear, EOMI, Oropharynx clear Neck supple CV: rrr no mrg Lungs: clear to auscultation, normal respiratory effort Abd: Soft, Nontender Ext: no edema Skin: No Rash Neuro: nonfocal MSK: no peripheral joint swelling/tenderness/warmth; back spines nontender     Labs: Lab Results  Component Value Date   WBC 3.4  (L) 10/10/2022   HGB 13.4 10/10/2022   HCT 41.2 10/10/2022   MCV 85.1 10/10/2022   PLT 198 10/10/2022   Last metabolic panel Lab Results  Component Value Date   GLUCOSE 82 10/10/2022   NA 136 10/10/2022   K 3.8 10/10/2022   CL 98 10/10/2022   CO2 30 10/10/2022   BUN 15 10/10/2022   CREATININE 1.16 10/10/2022   EGFR 69 10/10/2022   CALCIUM 9.6 10/10/2022   PROT 7.3 10/10/2022   ALBUMIN 4.1 07/17/2019   BILITOT 1.1  10/10/2022   ALKPHOS 50 07/17/2019   AST 30 10/10/2022   ALT 60 (H) 10/10/2022   ANIONGAP 3 (L) 07/18/2019    Serology: 10/2022 Hep b dna 352k  07/2022  Hep b DNA  06/2022 Hep b sAb <5; sAg reactive; eAg reactive; eAb nonreactive  Microbiology: No results found for this or any previous visit (from the past 240 hours).   Imaging: Personally reviewed/incorporated into decision making  12/26/21 liver elastography IMPRESSION: ULTRASOUND ABDOMEN:   Suspected fatty infiltration of liver as above.   Inadequate visualization of IVC, aorta, and pancreas due to bowel gas and body habitus.   ULTRASOUND HEPATIC ELASTOGRAPHY:   Median kPa:  5.4   Diagnostic category: < or = 9 kPa: in the absence of other known clinical signs, rules out cACLD    10/2022 liver u/s No acute abnormality or focal lesion    Raymondo Band, MD [facility] for Infectious Disease [facility] [phone] pager   [phone] cell 04/16/2023, 4:02 PM

## 2023-04-16 NOTE — Patient Instructions (Signed)
Will need hepatitis b labs twice a year; including now   Repeat liver ultrasound planned to screen for hepatic cancer (twice a year required as long as you have hep b)   See me every 6 months

## 2023-04-18 ENCOUNTER — Telehealth: Payer: Self-pay

## 2023-04-18 ENCOUNTER — Encounter: Payer: Self-pay | Admitting: Internal Medicine

## 2023-04-18 DIAGNOSIS — B181 Chronic viral hepatitis B without delta-agent: Secondary | ICD-10-CM

## 2023-04-18 LAB — CBC
HCT: 42.2 % (ref 38.5–50.0)
Hemoglobin: 13.6 g/dL (ref 13.2–17.1)
MCH: 27.3 pg (ref 27.0–33.0)
MCHC: 32.2 g/dL (ref 32.0–36.0)
MCV: 84.7 fL (ref 80.0–100.0)
MPV: 10.8 fL (ref 7.5–12.5)
Platelets: 200 10*3/uL (ref 140–400)
RBC: 4.98 10*6/uL (ref 4.20–5.80)
RDW: 13.2 % (ref 11.0–15.0)
WBC: 4.6 10*3/uL (ref 3.8–10.8)

## 2023-04-18 LAB — HEPATITIS B DNA, ULTRAQUANTITATIVE, PCR
Hepatitis B DNA: 2200 [IU]/mL — ABNORMAL HIGH
Hepatitis B virus DNA: 3.34 {Log} — ABNORMAL HIGH

## 2023-04-18 LAB — COMPLETE METABOLIC PANEL WITH GFR
AG Ratio: 1.7 (calc) (ref 1.0–2.5)
ALT: 31 U/L (ref 9–46)
AST: 20 U/L (ref 10–35)
Albumin: 4.7 g/dL (ref 3.6–5.1)
Alkaline phosphatase (APISO): 60 U/L (ref 35–144)
BUN: 14 mg/dL (ref 7–25)
CO2: 29 mmol/L (ref 20–32)
Calcium: 9.5 mg/dL (ref 8.6–10.3)
Chloride: 100 mmol/L (ref 98–110)
Creat: 1.02 mg/dL (ref 0.70–1.35)
Globulin: 2.8 g/dL (ref 1.9–3.7)
Glucose, Bld: 157 mg/dL — ABNORMAL HIGH (ref 65–99)
Potassium: 3.7 mmol/L (ref 3.5–5.3)
Sodium: 139 mmol/L (ref 135–146)
Total Bilirubin: 1 mg/dL (ref 0.2–1.2)
Total Protein: 7.5 g/dL (ref 6.1–8.1)
eGFR: 81 mL/min/{1.73_m2} (ref >=60)

## 2023-04-18 LAB — HEPATITIS B SURFACE ANTIGEN: Hepatitis B Surface Ag: REACTIVE — AB

## 2023-04-18 LAB — HEPATITIS B E ANTIBODY: Hep B E Ab: NONREACTIVE

## 2023-04-18 LAB — HEPATITIS B SURFACE ANTIBODY, QUANTITATIVE: Hep B S AB Quant (Post): <5 m[IU]/mL — ABNORMAL LOW (ref >=10)

## 2023-04-18 LAB — HEPATITIS B E ANTIGEN: Hep B E Ag: REACTIVE — AB

## 2023-04-18 NOTE — Telephone Encounter (Signed)
Thank you guys.

## 2023-04-18 NOTE — Telephone Encounter (Signed)
Patient called and asked if he could get US done at another facility besides [facility]. Informed him I would send a message to referral coordinator and provider. Patient stated that he doesn't want to go to the hospital to have US done.    I have ordered another Abdomen US RUQ/LIVER to be done at [facility].    Claris Che can you cancel [place] at [facility] and schedule at [facility] please?   Stephene Alegria Lesli Albee, CMA

## 2023-04-22 ENCOUNTER — Ambulatory Visit
Admission: RE | Admit: 2023-04-22 | Discharge: 2023-04-22 | Disposition: A | Discharge status: Home / Self Care | Payer: Managed Care, Other (non HMO) | Source: Ambulatory Visit | Attending: Internal Medicine | Admitting: Internal Medicine

## 2023-04-22 DIAGNOSIS — B181 Chronic viral hepatitis B without delta-agent: Secondary | ICD-10-CM

## 2023-04-23 ENCOUNTER — Ambulatory Visit (HOSPITAL_COMMUNITY): Payer: Managed Care, Other (non HMO)

## 2023-07-05 ENCOUNTER — Other Ambulatory Visit: Payer: Self-pay | Admitting: Internal Medicine

## 2023-07-05 DIAGNOSIS — B181 Chronic viral hepatitis B without delta-agent: Secondary | ICD-10-CM

## 2023-10-09 ENCOUNTER — Ambulatory Visit: Payer: Managed Care, Other (non HMO) | Admitting: Internal Medicine

## 2023-10-09 ENCOUNTER — Encounter: Payer: Self-pay | Admitting: Internal Medicine

## 2023-10-09 ENCOUNTER — Other Ambulatory Visit: Payer: Self-pay | Admitting: Physician Assistant

## 2023-10-09 ENCOUNTER — Other Ambulatory Visit: Payer: Self-pay

## 2023-10-09 VITALS — BP 166/81 | HR 75 | Temp 97.9°F | Wt 195.0 lb

## 2023-10-09 DIAGNOSIS — B180 Chronic viral hepatitis B with delta-agent: Secondary | ICD-10-CM

## 2023-10-09 NOTE — Patient Instructions (Signed)
 Your hep b is responding very well to entecavir , will continue that   Please improve exercise frequently to 3-5 times a week (30 minutes each day goal heart rate 120-140s)   Labs today  U/s liver within a month   See me in 6 months   Go have steak with your wife

## 2023-10-09 NOTE — Progress Notes (Signed)
 [facility]  Reason for Consult: Chronic hepatitis B Referring Physician: Dr. 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 He previously see dr Elaine.   No clear family hx but he thinks his mother died from hepatitis  He was told he has hep b since 58s   I reviewed his most recent labs from 2018: Hep a ab reactive Hep c ab nonreactive Hep d ab nonreactive Hep B sAg positive Fibrosis score 0.41  12/2017 hep b dna 88 million; lft  He has a liver biopsy in 2009 but I can't find the record of it on epic  His most recent lft 2023 is rising but unclear if due to meds, weight gain/diabetes. He doesn't drink alcohol  -------- 01/17/22 id assessment Chronic hep b; eAg positive Recent labs indicate no need for tx in terms of alt <2x unl, no sign of advance fibrosis on elastography, dna high but ok with eAg positive   07/12/22 id assessment I spoke with gi previously and due to high hep b dna level patient at risk for progression of complication and will start entecavir    10/10/22 id assessment Tolerating entecavir   He has no side effect to the entecavir  - no missed dose last 4 weeks Will get labs today and annual ultrasound F/u 6 months   04/16/23 id assessment Life and work is stressful --> 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; patient is Market researcher)  He is doing well on entecavir ; no missed dose last 4 weeks Due for repeat labs today 10/2022 limited liver u/s no hepatoma  Patient asks about when he could  stop the entecavir  -> no family hx liver disease, if the slim chance that he achieve seroconversion with sAb and sAG, and no sign of advance fibrosis, we can stop (obviously will need to do elastography at that time)   -repeat liver u/s biannual hcc screening -hepatitis b labs and basic chemistry/cbc -f/u 6 months -continue entecavir    10/09/23 id clinic assessment 04/2023 liver u/s no hcc Reviewed labs from 04/2023 -- chronic hep b eAg positive; dna 2200; alt 60 No complaint today On entecavir  and hep B dna has been coming down very nicely since 2024  No diabetes mellitus, but high cholesterol No etoh  -repeat labs today -defer u/s liver to every 6 months; discussed with him and he wants to continue doing this frequently -advise healthy life style and weight control -continue entecavir  -f/u 6 months     Patient Active Problem List   Diagnosis Date Noted   Prostate cancer (HCC) 07/17/2019   Leukopenia 12/24/2017   Elevated PSA 12/05/2017   Hypercholesterolemia 12/05/2017   Chronic viral hepatitis B without delta-agent (HCC) 11/20/2016   UTI (urinary tract infection) 08/05/2013    Patient's Medications  New Prescriptions   No medications on file  Previous Medications   AMLODIPINE  (NORVASC ) 5 MG TABLET    Take 5 mg by mouth every evening.  ASPIRIN EC 81 MG TABLET    1 tablet Orally Once a day for 360 day(s)   ENTECAVIR  (BARACLUDE ) 0.5 MG TABLET    TAKE 1 TABLET (0.5 MG TOTAL) DAILY   HYDROCHLOROTHIAZIDE  (HYDRODIURIL ) 25 MG TABLET    Take 12.5 mg by mouth daily.   HYDROCODONE -ACETAMINOPHEN  (NORCO) 5-325 MG TABLET    Take 1-2 tablets by mouth every 6 (six) hours as needed for moderate pain.   OLMESARTAN (BENICAR) 40 MG TABLET    Take 40 mg by mouth daily.   ROSUVASTATIN  (CRESTOR ) 10 MG TABLET    Take 10 mg by mouth every evening.    SULFAMETHOXAZOLE -TRIMETHOPRIM  (BACTRIM  DS) 800-160 MG TABLET    Take 1 tablet by mouth 2 (two) times daily. Start the day prior to foley removal  appointment  Modified Medications   No medications on file  Discontinued Medications   No medications on file    HPI: Tony Kim is a 68 y.o. male who is referred to me for evaluation and management of chronic hepatitis B. He states that he first learned of his hepatitis B Infection in the 1980s. He believes he was infected by his mother who died this past 09-03-23. She had hepatitis B infection. He denies other risk fctors. He says that he has never had any complications of his infection that he is aware of. He has never been on treatment. He says that his wife has been tested and is hepatitis B negative.He is not sure if she has ever been vaccinated. 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  ------ 12/19/21 clinica assessment He is referred here again to f/u on hep b, by his pcp He has mild lft elevation 2-3 times unl (which is 30 for male) the past month He is at baseline health as he last saw dr Elaine in 2018  No fatigue, no new polyarthralgia No fluid in belly No rash No kidney issue No LE edema No heart failure  I asked him about his family history for liver disease; he thinks his mom died from hepatitis but he is not sure   ------------ 07/12/22 id clinic f/u Doing well Get labs draw again  I discussed plan about hep b tx with him; GI had contacted us  about high level hep dna and advise tx  04/16/23 See a&p  Review of Systems: All other ros negative    Past Medical History:  Diagnosis Date   Allergic rhinitis    BPH (benign prostatic hyperplasia)    Mild   GERD (gastroesophageal reflux disease)    Hepatitis B    Hyperlipidemia    Hypertension    Prostate cancer (HCC)    Thyroid  nodule     Social History   Tobacco Use   Smoking status: Never   Smokeless tobacco: Never  Vaping Use   Vaping status: Never Used  Substance Use Topics   Alcohol use: Not Currently     Alcohol/week: 2.0 standard drinks of alcohol    Types: 2 Standard drinks or equivalent per week   Drug use: No    Family History  Problem Relation Age of Onset   Hyperlipidemia Mother    Hyperlipidemia Father    No Known Allergies  OBJECTIVE: Vitals:   10/09/23 1613  BP: (!) 166/81  Pulse: 75  Temp: 97.9 F (36.6 C)  TempSrc: Oral  SpO2: 97%  Weight: 195 lb (88.5 kg)  Body mass index is 31.47 kg/m.  Physical exam: General/constitutional: no distress, pleasant HEENT: Normocephalic, PER, Conj Clear, EOMI, Oropharynx clear Neck supple CV: rrr no mrg Lungs: clear to auscultation, normal respiratory effort Abd: Soft, Nontender Ext: no edema Skin: No Rash Neuro: nonfocal MSK: no peripheral joint swelling/tenderness/warmth; back spines nontender     Labs: Lab Results  Component Value Date   WBC 4.6 04/16/2023   HGB 13.6 04/16/2023   HCT 42.2 04/16/2023   MCV 84.7 04/16/2023   PLT 200 04/16/2023   Last metabolic panel Lab Results  Component Value Date   GLUCOSE 157 (H) 04/16/2023   NA 139 04/16/2023   K 3.7 04/16/2023   CL 100 04/16/2023   CO2 29 04/16/2023   BUN 14 04/16/2023   CREATININE 1.02 04/16/2023   EGFR 81 04/16/2023   CALCIUM  9.5 04/16/2023   PROT 7.5 04/16/2023   ALBUMIN 4.1 07/17/2019   BILITOT 1.0 04/16/2023   ALKPHOS 50 07/17/2019   AST 20 04/16/2023   ALT 31 04/16/2023   ANIONGAP 3 (L) 07/18/2019    Serology: 10/2022 Hep b dna 352k  07/2022  Hep b DNA  06/2022 Hep b sAb <5; sAg reactive; eAg reactive; eAb nonreactive  Microbiology: No results found for this or any previous visit (from the past 240 hours).   Imaging: Personally reviewed/incorporated into decision making  12/26/21 liver elastography IMPRESSION: ULTRASOUND ABDOMEN:   Suspected fatty infiltration of liver as above.   Inadequate visualization of IVC, aorta, and pancreas due to bowel gas and body habitus.   ULTRASOUND HEPATIC ELASTOGRAPHY:    Median kPa:  5.4   Diagnostic category: < or = 9 kPa: in the absence of other known clinical signs, rules out cACLD    04/2023 No acute abnormality or focal lesion    Constance ONEIDA Passer, MD [facility] [phone] pager   [phone] cell 10/09/2023, 4:16 PM

## 2023-10-11 LAB — COMPLETE METABOLIC PANEL WITHOUT GFR
AG Ratio: 1.6 (calc) (ref 1.0–2.5)
ALT: 29 U/L (ref 9–46)
AST: 17 U/L (ref 10–35)
Albumin: 4.3 g/dL (ref 3.6–5.1)
Alkaline phosphatase (APISO): 52 U/L (ref 35–144)
BUN: 14 mg/dL (ref 7–25)
CO2: 31 mmol/L (ref 20–32)
Calcium: 9.2 mg/dL (ref 8.6–10.3)
Chloride: 99 mmol/L (ref 98–110)
Creat: 1.17 mg/dL (ref 0.70–1.35)
Globulin: 2.7 g/dL (ref 1.9–3.7)
Glucose, Bld: 118 mg/dL — ABNORMAL HIGH (ref 65–99)
Potassium: 3.6 mmol/L (ref 3.5–5.3)
Sodium: 138 mmol/L (ref 135–146)
Total Bilirubin: 1.1 mg/dL (ref 0.2–1.2)
Total Protein: 7 g/dL (ref 6.1–8.1)

## 2023-10-11 LAB — CBC WITH DIFFERENTIAL/PLATELET
Absolute Lymphocytes: 1502 {cells}/uL (ref 850–3900)
Absolute Monocytes: 218 {cells}/uL (ref 200–950)
Basophils Absolute: 31 {cells}/uL (ref 0–200)
Basophils Relative: 0.8 %
Eosinophils Absolute: 655 {cells}/uL — ABNORMAL HIGH (ref 15–500)
Eosinophils Relative: 16.8 %
HCT: 41.9 % (ref 38.5–50.0)
Hemoglobin: 13.8 g/dL (ref 13.2–17.1)
MCH: 28 pg (ref 27.0–33.0)
MCHC: 32.9 g/dL (ref 32.0–36.0)
MCV: 85 fL (ref 80.0–100.0)
MPV: 10.8 fL (ref 7.5–12.5)
Monocytes Relative: 5.6 %
Neutro Abs: 1494 {cells}/uL — ABNORMAL LOW (ref 1500–7800)
Neutrophils Relative %: 38.3 %
Platelets: 211 Thousand/uL (ref 140–400)
RBC: 4.93 Million/uL (ref 4.20–5.80)
RDW: 13.1 % (ref 11.0–15.0)
Total Lymphocyte: 38.5 %
WBC: 3.9 Thousand/uL (ref 3.8–10.8)

## 2023-10-11 LAB — HEPATITIS B E ANTIGEN: Hep B E Ag: REACTIVE — AB

## 2023-10-11 LAB — HEPATITIS B SURFACE ANTIBODY,QUALITATIVE: Hep B S Ab: NONREACTIVE

## 2023-10-11 LAB — HEPATITIS B DNA, ULTRAQUANTITATIVE, PCR
Hepatitis B DNA: 433 [IU]/mL — ABNORMAL HIGH
Hepatitis B virus DNA: 2.64 {Log_IU}/mL — ABNORMAL HIGH

## 2023-10-16 ENCOUNTER — Telehealth: Payer: Self-pay

## 2023-10-16 NOTE — Telephone Encounter (Signed)
 Received call from [facility] regarding imaging scheduled for 8/15.   Patient is scheduled for liver US  with liver doppler, they are wanting to confirm that this is the procedure the provider wanted as opposed to the RUQ abdominal US .   Will route to provider.   [facility] [phone] --> option 1 --> option 5  Maddison Kilner, BSN, Charity fundraiser

## 2023-10-17 ENCOUNTER — Other Ambulatory Visit

## 2023-10-17 NOTE — Telephone Encounter (Signed)
 Spoke with Tony Kim at [facility] and relayed per Cathlyn July, NP that RUQ abdominal US  should be fine instead of the liver doppler. She will update the appointment.   Windi Toro, BSN, RN

## 2023-10-18 ENCOUNTER — Other Ambulatory Visit: Payer: Self-pay | Admitting: Internal Medicine

## 2023-10-18 ENCOUNTER — Inpatient Hospital Stay: Admission: RE | Admit: 2023-10-18 | Source: Ambulatory Visit

## 2023-10-18 ENCOUNTER — Ambulatory Visit
Admission: RE | Admit: 2023-10-18 | Discharge: 2023-10-18 | Disposition: A | Discharge status: Home / Self Care | Source: Ambulatory Visit | Attending: Internal Medicine | Admitting: Internal Medicine

## 2023-10-18 DIAGNOSIS — B181 Chronic viral hepatitis B without delta-agent: Secondary | ICD-10-CM

## 2024-02-28 ENCOUNTER — Telehealth: Payer: Self-pay

## 2024-02-28 ENCOUNTER — Other Ambulatory Visit: Payer: Self-pay | Admitting: Internal Medicine

## 2024-02-28 DIAGNOSIS — B181 Chronic viral hepatitis B without delta-agent: Secondary | ICD-10-CM

## 2024-02-28 NOTE — Telephone Encounter (Signed)
 Called patient to schedule appointment, no answer left vm to contact clinic.
